# Patient Record
Sex: Female | Born: 1945 | Race: White | Hispanic: No | Smoking: Current every day smoker
Health system: Southern US, Community
[De-identification: ages and names within clinical notes are randomized; demographics above are authoritative.]

## PROBLEM LIST (undated history)

## (undated) DIAGNOSIS — K219 Gastro-esophageal reflux disease without esophagitis: Secondary | ICD-10-CM

## (undated) DIAGNOSIS — E7800 Pure hypercholesterolemia, unspecified: Secondary | ICD-10-CM

## (undated) DIAGNOSIS — K5190 Ulcerative colitis, unspecified, without complications: Secondary | ICD-10-CM

## (undated) DIAGNOSIS — K8689 Other specified diseases of pancreas: Secondary | ICD-10-CM

## (undated) DIAGNOSIS — Z972 Presence of dental prosthetic device (complete) (partial): Secondary | ICD-10-CM

## (undated) HISTORY — DX: Other specified diseases of pancreas: K86.89

## (undated) HISTORY — DX: Ulcerative colitis, unspecified, without complications: K51.90

## (undated) HISTORY — DX: Pure hypercholesterolemia, unspecified: E78.00

---
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
## 1965-10-21 HISTORY — PX: PILONIDAL CYST EXCISION: SHX744

## 2014-10-23 DIAGNOSIS — E7800 Pure hypercholesterolemia, unspecified: Secondary | ICD-10-CM | POA: Insufficient documentation

## 2014-10-23 HISTORY — DX: Pure hypercholesterolemia, unspecified: E78.00

## 2014-11-08 HISTORY — PX: COLONOSCOPY: SHX174

## 2015-01-22 DIAGNOSIS — K5190 Ulcerative colitis, unspecified, without complications: Secondary | ICD-10-CM

## 2015-01-22 HISTORY — DX: Ulcerative colitis, unspecified, without complications: K51.90

## 2015-03-07 ENCOUNTER — Other Ambulatory Visit: Payer: Self-pay

## 2015-03-07 DIAGNOSIS — K8689 Other specified diseases of pancreas: Secondary | ICD-10-CM | POA: Insufficient documentation

## 2015-03-07 HISTORY — DX: Other specified diseases of pancreas: K86.89

## 2015-03-08 ENCOUNTER — Encounter (INDEPENDENT_AMBULATORY_CARE_PROVIDER_SITE_OTHER): Payer: Self-pay

## 2015-03-08 ENCOUNTER — Encounter: Payer: Self-pay | Admitting: Gastroenterology

## 2015-03-08 ENCOUNTER — Ambulatory Visit (INDEPENDENT_AMBULATORY_CARE_PROVIDER_SITE_OTHER): Payer: Medicare Other | Admitting: Gastroenterology

## 2015-03-08 VITALS — BP 188/81 | HR 74 | Temp 97.8°F | Ht 64.0 in | Wt 120.0 lb

## 2015-03-08 DIAGNOSIS — K5120 Ulcerative (chronic) proctitis without complications: Secondary | ICD-10-CM

## 2015-03-08 NOTE — Progress Notes (Signed)
Gastroenterology Consultation  Referring Provider:     Kirk Ruths, MD Primary Care Physician:  Kirk Ruths., MD Primary Gastroenterologist:  Dr. Allen Norris     Reason for Consultation:     Ulcerative colitis        HPI:   Denise Moore is a 70 y.o. y/o female referred for consultation & management of  Ulcerative colitis by Dr. Kirk Ruths., MD.   This patient comes today after being by Dr. Rayann Heman.  The patient reports that she does not want to see him anymore and wants to transfer her care to me. She states that her colitis was limited to her rectum.She now reports that she has approximately one bowel movements a day. The patient also has a history of a anal fissure with surgical repair. There is no report of any unexplained weight loss, Fevers, chills, nausea or vomiting. She was found to have a elevated ESR and has had an increased white cell count.    Past Medical History  Diagnosis Date  . Chronic ulcerative colitis ([place]) 01/22/2015    Last Assessment & Plan:  Post decadron treatment last month. Off now and feeling great.    . Pancreatic insufficiency ([place]) 03/07/2015    Last Assessment & Plan:  On her pancrease supplement.    . Pure hypercholesterolemia 10/23/2014    Last Assessment & Plan:  On diet for now     Past Surgical History  Procedure Laterality Date  . Fistua repair  04/07/1956  . Pilonidal cyst excision  10/21/1965    Prior to Admission medications   Medication Sig Start Date End Date Taking? Authorizing Provider  Cetirizine HCl 10 MG CAPS Take by mouth. Reported on 03/08/2015 12/25/14   Historical Provider, MD  mesalamine (APRISO) 0.375 g 24 hr capsule Take 4 capsules (1.5 g total) by mouth once daily. 02/06/15  Yes Historical Provider, MD  Pancrelipase, Lip-Prot-Amyl, 24000 units CPEP Take by mouth. Reported on 03/08/2015 10/23/14 10/23/15  Historical Provider, MD  Probiotic CAPS Take by mouth. Reported on 03/08/2015    Historical Provider, MD    Family  History  Problem Relation Age of Onset  . Cancer Mother   . Diabetes Father   . Heart disease Father      Social History  Substance Use Topics  . Smoking status: Current Every Day Smoker  . Smokeless tobacco: Never Used  . Alcohol Use: No    Allergies as of 03/08/2015 - Review Complete 03/08/2015  Allergen Reaction Noted  . Other Itching 03/07/2015  . Penicillins Rash 03/07/2015    Review of Systems:    All systems reviewed and negative except where noted in HPI.   Physical Exam:  BP 188/81 mmHg  Pulse 74  Temp(Src) 97.8 F (36.6 C)  Ht 5' 4"  (1.626 m)  Wt 120 lb (54.432 kg)  BMI 20.59 kg/m2 No LMP recorded. Psych:  Alert and cooperative. Normal mood and affect. General:   Alert,  Well-developed, well-nourished, pleasant and cooperative in NAD Head:  Normocephalic and atraumatic. Eyes:  Sclera clear, no icterus.   Conjunctiva pink. Ears:  Normal auditory acuity. Nose:  No deformity, discharge, or lesions. Mouth:  No deformity or lesions,oropharynx pink & moist. Neck:  Supple; no masses or thyromegaly. Lungs:  Respirations even and unlabored.  Clear throughout to auscultation.   No wheezes, crackles, or rhonchi. No acute distress. Heart:  Regular rate and rhythm; no murmurs, clicks, rubs, or gallops. Abdomen:  Normal bowel sounds.  No bruits.  Soft, non-tender and non-distended without masses, hepatosplenomegaly or hernias noted.  No guarding or rebound tenderness.  Negative Carnett sign.   Rectal:  Deferred.  Msk:  Symmetrical without gross deformities.  Good, equal movement & strength bilaterally. Pulses:  Normal pulses noted. Extremities:  No clubbing or edema.  No cyanosis. Neurologic:  Alert and oriented x3;  grossly normal neurologically. Skin:  Intact without significant lesions or rashes.  No jaundice. Lymph Nodes:  No significant cervical adenopathy. Psych:  Alert and cooperative. Normal mood and affect.  Imaging Studies: No results found.  Assessment  and Plan:   Denise Moore is a 70 y.o. y/o female  Who has a history of ulcerative colitis diagnosed with a colonoscopy by Dr. Rayann Heman. The patient no longer wants to follow up with him. The patient is now on Apriso and takes 1 pill 4 times a day instead of 4 pils in the am as she is suppose to. The patient would like to decrease the number of pills she takes her day.. She has been told to decrease the number to 3 pills qam and see how she does. The patient has been explained the plan and agrees with it.   Note: This dictation was prepared with Dragon dictation along with smaller phrase technology. Any transcriptional errors that result from this process are unintentional.

## 2015-04-20 ENCOUNTER — Telehealth: Payer: Self-pay | Admitting: Gastroenterology

## 2015-04-23 ENCOUNTER — Telehealth: Payer: Self-pay | Admitting: Gastroenterology

## 2015-04-23 NOTE — Telephone Encounter (Signed)
Pt stated her diarrhea has worsen over the weekend. The worse being yesterday (Sunday) She currently is taking Apriso and Creon. She is requesting a medication to help stop the diarrhea.

## 2015-04-23 NOTE — Telephone Encounter (Signed)
After talking to you the other day she is now having a big flare up with her diarrhea. Please call

## 2015-04-24 NOTE — Telephone Encounter (Signed)
error 

## 2015-04-24 NOTE — Telephone Encounter (Signed)
Please start the patient on a steroid taper starting at 40 mg for 2 week and then decreasing by 10 mg.

## 2015-04-24 NOTE — Telephone Encounter (Signed)
Pt went to her PCP today and was prescribed prednisone taper by Mortimer Fries, PA. Advised her Dr. Allen Norris was going to prescribe prednisone as well. Told her to go ahead and start that and let me know if it doesn't work and I will discuss with Dr. Allen Norris and we can decide on a longer taper if needed.

## 2015-04-27 ENCOUNTER — Telehealth: Payer: Self-pay | Admitting: Gastroenterology

## 2015-04-27 NOTE — Telephone Encounter (Signed)
Patient would like to talk to you regarding the prednisone she is taking.

## 2015-04-27 NOTE — Telephone Encounter (Signed)
Advised pt to continue current course of her antibiotics and call me once she completes if she is still having diarrhea.

## 2015-05-08 ENCOUNTER — Other Ambulatory Visit: Payer: Self-pay

## 2015-05-08 DIAGNOSIS — K51819 Other ulcerative colitis with unspecified complications: Secondary | ICD-10-CM

## 2015-05-08 MED ORDER — MESALAMINE ER 0.375 G PO CP24: ORAL_CAPSULE | ORAL | Status: DC

## 2015-08-13 DIAGNOSIS — I10 Essential (primary) hypertension: Secondary | ICD-10-CM | POA: Insufficient documentation

## 2015-08-13 DIAGNOSIS — R030 Elevated blood-pressure reading, without diagnosis of hypertension: Secondary | ICD-10-CM | POA: Insufficient documentation

## 2015-10-02 ENCOUNTER — Telehealth: Payer: Self-pay

## 2015-10-02 ENCOUNTER — Other Ambulatory Visit: Payer: Self-pay

## 2015-10-02 DIAGNOSIS — K51919 Ulcerative colitis, unspecified with unspecified complications: Secondary | ICD-10-CM

## 2015-10-02 MED ORDER — PREDNISONE 20 MG PO TABS: ORAL_TABLET | ORAL | 0 refills | Status: DC

## 2015-10-02 NOTE — Telephone Encounter (Signed)
Pt notified Dr. Allen Norris approved her request for prednisone. This was called into her pharmacy.

## 2015-10-02 NOTE — Telephone Encounter (Signed)
Pt called today stating she is having a flare of her colitis. She is currently taking Apriso daily. Pt would like a round of Prednisone to calm down her flare. Please advise.

## 2015-10-02 NOTE — Telephone Encounter (Signed)
Yes. Please start at 4m per day and go down every 2 weeks.

## 2015-11-06 ENCOUNTER — Other Ambulatory Visit: Payer: Self-pay | Admitting: Gastroenterology

## 2015-11-26 ENCOUNTER — Telehealth: Payer: Self-pay | Admitting: Gastroenterology

## 2015-11-26 NOTE — Telephone Encounter (Signed)
Please advise if I can sent the refill or would you prefer a CRP first?

## 2015-11-26 NOTE — Telephone Encounter (Signed)
These refilled the prednisone.

## 2015-11-26 NOTE — Telephone Encounter (Signed)
Needs a refill on predisone. Feels like she is getting another flare up of UC. Please call

## 2015-11-27 NOTE — Telephone Encounter (Signed)
Advised her you ok'd the refill but is wondering if we needs to change the Apriso to something else? She is taking this 4 times daily but has started having watery stools for several days. Please advise.

## 2015-11-28 ENCOUNTER — Other Ambulatory Visit: Payer: Self-pay

## 2015-11-28 MED ORDER — PREDNISONE 20 MG PO TABS: ORAL_TABLET | ORAL | 0 refills | Status: DC

## 2015-11-28 NOTE — Telephone Encounter (Signed)
Patient called saying that she wants to take the prednisone and if you could have it sent to her pharmacy.

## 2015-11-28 NOTE — Telephone Encounter (Signed)
Rx sent to pt's pharmacy

## 2015-11-30 ENCOUNTER — Other Ambulatory Visit: Payer: Self-pay

## 2015-11-30 ENCOUNTER — Telehealth: Payer: Self-pay | Admitting: Gastroenterology

## 2015-11-30 MED ORDER — PREDNISONE 20 MG PO TABS: ORAL_TABLET | ORAL | 0 refills | Status: DC

## 2015-11-30 NOTE — Telephone Encounter (Signed)
Medication re-sent to pharmacy at this time.

## 2015-11-30 NOTE — Telephone Encounter (Signed)
Pt called and stated that she talked to Ginger this am, even though she knows the office was closed. Ginger stated that she called in the rx for Prednisone on Wed 11/28/15, but the pharmacy hasn't received it yet. Please advise.

## 2016-02-14 ENCOUNTER — Telehealth: Payer: Self-pay | Admitting: Gastroenterology

## 2016-02-14 NOTE — Telephone Encounter (Signed)
Left voice message for patient to call and schedule appt with GI for chronic ulcerative colitis. Referred by [facility] dr. Ouida Sills

## 2016-02-25 ENCOUNTER — Other Ambulatory Visit: Payer: Self-pay

## 2016-02-26 ENCOUNTER — Encounter: Payer: Self-pay | Admitting: Gastroenterology

## 2016-02-26 ENCOUNTER — Ambulatory Visit (INDEPENDENT_AMBULATORY_CARE_PROVIDER_SITE_OTHER): Payer: Medicare Other | Admitting: Gastroenterology

## 2016-02-26 VITALS — BP 164/76 | HR 83 | Temp 97.9°F | Ht 64.0 in | Wt 126.0 lb

## 2016-02-26 DIAGNOSIS — K51919 Ulcerative colitis, unspecified with unspecified complications: Secondary | ICD-10-CM

## 2016-02-26 MED ORDER — PANTOPRAZOLE SODIUM 40 MG PO TBEC: 40.0000 mg | DELAYED_RELEASE_TABLET | Freq: Every day | ORAL | 11 refills | Status: DC

## 2016-02-26 NOTE — Progress Notes (Signed)
Primary Care Physician: Kirk Ruths., MD  Primary Gastroenterologist:  Dr. Lucilla Lame  No chief complaint on file.   HPI: Denise Moore is a 71 y.o. female here for follow-up of her ulcerative colitis.  The patient had a colonoscopy in the past by Dr. Rayann Heman.  The patient comes in today with the report which appears to show pancolitis.  The patient is now on Apriso.  The patient has had a few episodes of diarrhea and a report of flares.  The patient has called in and been started on steroid tapers for her flares.  She reports that she had one watery bowel movement recently but otherwise has been stable.  Current Outpatient Prescriptions  Medication Sig Dispense Refill  . APRISO 0.375 g 24 hr capsule TAKE 4 CAPSULES(1.5 GRAMS) BY MOUTH EVERY DAY 120 capsule 6  . Cetirizine HCl 10 MG CAPS Take by mouth. Reported on 03/08/2015    . pantoprazole (PROTONIX) 40 MG tablet Take 1 tablet (40 mg total) by mouth daily. 30 tablet 11  . predniSONE (DELTASONE) 20 MG tablet Take taper dose starting at 75m x 2 weeks, 365m x 2 weeks, 2081mx 2 weeks, then 10 mgs x 2 weeks (Patient not taking: Reported on 02/26/2016) 60 tablet 0  . Probiotic CAPS Take by mouth. Reported on 03/08/2015     No current facility-administered medications for this visit.     Allergies as of 02/26/2016 - Review Complete 02/26/2016  Allergen Reaction Noted  . Other Itching 09/08/2014  . Penicillins Rash 03/07/2015    ROS:  General: Negative for anorexia, weight loss, fever, chills, fatigue, weakness. ENT: Negative for hoarseness, difficulty swallowing , nasal congestion. CV: Negative for chest pain, angina, palpitations, dyspnea on exertion, peripheral edema.  Respiratory: Negative for dyspnea at rest, dyspnea on exertion, cough, sputum, wheezing.  GI: See history of present illness. GU:  Negative for dysuria, hematuria, urinary incontinence, urinary frequency, nocturnal urination.  Endo: Negative for unusual weight  change.    Physical Examination:   BP (!) 164/76   Pulse 83   Temp 97.9 F (36.6 C) (Oral)   Ht 5' 4"  (1.626 m)   Wt 126 lb (57.2 kg)   BMI 21.63 kg/m   General: Well-nourished, well-developed in no acute distress.  Eyes: No icterus. Conjunctivae pink. Mouth: Oropharyngeal mucosa moist and pink , no lesions erythema or exudate. Lungs: Clear to auscultation bilaterally. Non-labored. Heart: Regular rate and rhythm, no murmurs rubs or gallops.  Abdomen: Bowel sounds are normal, nontender, nondistended, no hepatosplenomegaly or masses, no abdominal bruits or hernia , no rebound or guarding.   Extremities: No lower extremity edema. No clubbing or deformities. Neuro: Alert and oriented x 3.  Grossly intact. Skin: Warm and dry, no jaundice.   Psych: Alert and cooperative, normal mood and affect.  Labs:    Imaging Studies: No results found.  Assessment and Plan:   [name] a [age]. y/o female With a history of ulcerative colitis who is doing well on Apriso.  The patient has been told that if she has any further issues with suspected flares we will send her off for a CRP.  The patient will continue her Protonix which she has been given for heartburn.  The patient has been explained the plan and agrees with it.    DarLucilla LameD. FACMarval RegalNote: This dictation was prepared with Dragon dictation along with smaller phrase technology. Any transcriptional errors that result from this process are unintentional.

## 2016-03-14 ENCOUNTER — Telehealth: Payer: Self-pay | Admitting: Gastroenterology

## 2016-03-14 NOTE — Telephone Encounter (Signed)
Patient is calling to get results

## 2016-04-17 NOTE — Telephone Encounter (Signed)
Advised pt labs do no appear to have been done. Pt stated she turned them in but it does not show in process.

## 2016-05-22 ENCOUNTER — Other Ambulatory Visit: Payer: Self-pay | Admitting: Gastroenterology

## 2016-06-03 ENCOUNTER — Other Ambulatory Visit: Payer: Self-pay

## 2016-06-03 ENCOUNTER — Telehealth: Payer: Self-pay | Admitting: Gastroenterology

## 2016-06-03 MED ORDER — MESALAMINE ER 0.375 G PO CP24: ORAL_CAPSULE | ORAL | 5 refills | Status: DC

## 2016-06-03 NOTE — Telephone Encounter (Signed)
Needs refill on Apriso 0.375 gm [facility] in [place]

## 2016-06-04 NOTE — Telephone Encounter (Signed)
Refill for Denise Moore has been sent to pt's pharmacy per her request.

## 2016-06-23 ENCOUNTER — Telehealth: Payer: Self-pay | Admitting: Gastroenterology

## 2016-06-23 NOTE — Telephone Encounter (Signed)
Patient left a voice message that she needs a RX called in for a flare up. She didn't say what she needed.

## 2016-06-23 NOTE — Telephone Encounter (Signed)
Pt is requesting a round prednisone because she is having a flare of her UC. She is having multiple episodes of diarrhea. She is currently taking Apriso. Should we get a CRP first before refilling Prednisone?

## 2016-06-23 NOTE — Telephone Encounter (Signed)
Please set her up for a prednisone tap 6 weeks and have her comment to see Dr. Marius Ditch when she starts.

## 2016-06-24 ENCOUNTER — Other Ambulatory Visit: Payer: Self-pay

## 2016-06-24 MED ORDER — PREDNISONE 20 MG PO TABS: ORAL_TABLET | ORAL | 0 refills | Status: DC

## 2016-06-24 NOTE — Telephone Encounter (Signed)
Patient left a voice message that she was waiting on a phone call from you. No other info given

## 2016-06-24 NOTE — Telephone Encounter (Signed)
Rx called into pt's pharmacy.

## 2016-08-11 ENCOUNTER — Telehealth: Payer: Self-pay | Admitting: Gastroenterology

## 2016-08-11 NOTE — Telephone Encounter (Signed)
Patient is having a flare up and loose stools. Please call

## 2016-08-12 ENCOUNTER — Other Ambulatory Visit: Payer: Self-pay

## 2016-08-12 ENCOUNTER — Other Ambulatory Visit
Admission: RE | Admit: 2016-08-12 | Discharge: 2016-08-12 | Disposition: A | Discharge status: Home / Self Care | Payer: Medicare Other | Source: Ambulatory Visit | Attending: Gastroenterology | Admitting: Gastroenterology

## 2016-08-12 DIAGNOSIS — R197 Diarrhea, unspecified: Secondary | ICD-10-CM

## 2016-08-12 DIAGNOSIS — K51919 Ulcerative colitis, unspecified with unspecified complications: Secondary | ICD-10-CM | POA: Insufficient documentation

## 2016-08-12 LAB — C-REACTIVE PROTEIN: CRP: 2.5 mg/dL — ABNORMAL HIGH (ref <1.0)

## 2016-08-12 NOTE — Telephone Encounter (Signed)
Spoke with pt regarding her diarrhea. Pt stated she has titrated her prednisone down to 38m daily and she feels she is now having loose stools again. Advised her at her last office appt Dr. WAllen Norrishad requested a couple of labs to be done. We will add a CRP to those labs and [facility] if infact she is having a colitis flare.

## 2016-08-13 LAB — H. PYLORI ANTIGEN, STOOL: H. Pylori Stool Ag, Eia: NEGATIVE

## 2016-08-14 LAB — CALPROTECTIN, FECAL: Calprotectin, Fecal: 295 ug/g — ABNORMAL HIGH (ref 0–120)

## 2016-08-15 ENCOUNTER — Telehealth: Payer: Self-pay | Admitting: Gastroenterology

## 2016-08-15 NOTE — Telephone Encounter (Signed)
Please call patient with lab results

## 2016-08-18 ENCOUNTER — Telehealth: Payer: Self-pay | Admitting: Gastroenterology

## 2016-08-18 MED ORDER — PREDNISONE 10 MG PO TABS: ORAL_TABLET | ORAL | 0 refills | Status: DC

## 2016-08-18 NOTE — Telephone Encounter (Signed)
Please review labs and advise. They are resulted.

## 2016-08-18 NOTE — Telephone Encounter (Signed)
Patient would like to get results ASAP. She left a voice message that she is still having diarrhea and stomach pain. Please call

## 2016-08-18 NOTE — Telephone Encounter (Signed)
Start the patient on steroids and have her see Dr. Marius Ditch

## 2016-08-18 NOTE — Telephone Encounter (Signed)
Rx for Prednisone has been called into pt's pharmacy per Dr. Allen Norris. Pt has been notified.

## 2016-09-16 ENCOUNTER — Ambulatory Visit: Payer: Medicare Other | Admitting: Gastroenterology

## 2016-10-02 ENCOUNTER — Ambulatory Visit (INDEPENDENT_AMBULATORY_CARE_PROVIDER_SITE_OTHER): Payer: Medicare Other | Admitting: Gastroenterology

## 2016-10-02 ENCOUNTER — Other Ambulatory Visit
Admission: RE | Admit: 2016-10-02 | Discharge: 2016-10-02 | Disposition: A | Discharge status: Home / Self Care | Payer: Medicare Other | Source: Ambulatory Visit | Attending: Gastroenterology | Admitting: Gastroenterology

## 2016-10-02 ENCOUNTER — Other Ambulatory Visit: Payer: Self-pay

## 2016-10-02 ENCOUNTER — Encounter: Payer: Self-pay | Admitting: Gastroenterology

## 2016-10-02 ENCOUNTER — Ambulatory Visit: Payer: Medicare Other | Admitting: Gastroenterology

## 2016-10-02 ENCOUNTER — Encounter (INDEPENDENT_AMBULATORY_CARE_PROVIDER_SITE_OTHER): Payer: Self-pay

## 2016-10-02 VITALS — BP 174/78 | Temp 99.0°F | Ht 64.0 in | Wt 123.0 lb

## 2016-10-02 DIAGNOSIS — K5100 Ulcerative (chronic) pancolitis without complications: Secondary | ICD-10-CM | POA: Insufficient documentation

## 2016-10-02 DIAGNOSIS — Z0000 Encounter for general adult medical examination without abnormal findings: Secondary | ICD-10-CM

## 2016-10-02 DIAGNOSIS — K8681 Exocrine pancreatic insufficiency: Secondary | ICD-10-CM

## 2016-10-02 DIAGNOSIS — Z7952 Long term (current) use of systemic steroids: Secondary | ICD-10-CM | POA: Diagnosis not present

## 2016-10-02 DIAGNOSIS — K8689 Other specified diseases of pancreas: Secondary | ICD-10-CM

## 2016-10-02 LAB — CBC
HEMATOCRIT: 42.9 % (ref 35.0–47.0)
HEMOGLOBIN: 14.6 g/dL (ref 12.0–16.0)
MCH: 30.8 pg (ref 26.0–34.0)
MCHC: 33.9 g/dL (ref 32.0–36.0)
MCV: 90.9 fL (ref 80.0–100.0)
Platelets: 213 10*3/uL (ref 150–440)
RBC: 4.73 MIL/uL (ref 3.80–5.20)
RDW: 14.4 % (ref 11.5–14.5)
WBC: 16.2 10*3/uL — ABNORMAL HIGH (ref 3.6–11.0)

## 2016-10-02 LAB — BASIC METABOLIC PANEL
ANION GAP: 9 (ref 5–15)
BUN: 9 mg/dL (ref 6–20)
CHLORIDE: 102 mmol/L (ref 101–111)
CO2: 25 mmol/L (ref 22–32)
CREATININE: 0.67 mg/dL (ref 0.44–1.00)
Calcium: 8.8 mg/dL — ABNORMAL LOW (ref 8.9–10.3)
GFR calc non Af Amer: >60 mL/min (ref >60)
Glucose, Bld: 94 mg/dL (ref 65–99)
Potassium: 3.8 mmol/L (ref 3.5–5.1)
Sodium: 136 mmol/L (ref 135–145)

## 2016-10-02 LAB — HEPATIC FUNCTION PANEL
ALBUMIN: 3.9 g/dL (ref 3.5–5.0)
ALT: 15 U/L (ref 14–54)
AST: 18 U/L (ref 15–41)
Alkaline Phosphatase: 80 U/L (ref 38–126)
Bilirubin, Direct: <0.1 mg/dL — ABNORMAL LOW (ref 0.1–0.5)
TOTAL PROTEIN: 7.6 g/dL (ref 6.5–8.1)
Total Bilirubin: 0.5 mg/dL (ref 0.3–1.2)

## 2016-10-02 LAB — IRON AND TIBC
Iron: 22 ug/dL — ABNORMAL LOW (ref 28–170)
Saturation Ratios: 7 % — ABNORMAL LOW (ref 10.4–31.8)
TIBC: 304 ug/dL (ref 250–450)
UIBC: 282 ug/dL

## 2016-10-02 LAB — FERRITIN: FERRITIN: 32 ng/mL (ref 11–307)

## 2016-10-02 LAB — PROTIME-INR
INR: 1.02
PROTHROMBIN TIME: 13.3 s (ref 11.4–15.2)

## 2016-10-02 MED ORDER — PANCRELIPASE (LIP-PROT-AMYL) 40000-126000 UNITS PO CPEP: 80000.0000 [IU] | ORAL_CAPSULE | Freq: Three times a day (TID) | ORAL | 1 refills | Status: DC

## 2016-10-02 MED ORDER — BALSALAZIDE DISODIUM 750 MG PO CAPS: 2250.0000 mg | ORAL_CAPSULE | Freq: Three times a day (TID) | ORAL | 2 refills | Status: DC

## 2016-10-02 MED ORDER — BUDESONIDE ER 9 MG PO TB24: 9.0000 mg | ORAL_TABLET | Freq: Every day | ORAL | 0 refills | Status: DC

## 2016-10-02 NOTE — Patient Instructions (Addendum)
1. Start mesalamine as prescribed 2. Start uceris as prescribed 3. CT pancreas and DEXA scan 4. EGD to look for celiac 5. Start pancreatic enzymes as directed  Please call our office to speak with my nurse Driscilla Grammes at [phone] during business hours from 8am to 4pm if you have any questions/concerns. During after hours, you will be redirected to on call GI physician. For any emergency please call 911 or go the nearest emergency room.    Cephas Darby, MD [address]  [place]  [place], [place] [address]  Main: [phone]  Fax: [phone]

## 2016-10-02 NOTE — Progress Notes (Addendum)
Cephas Darby, MD [address]  [place]  [place], [place] [address]  Main: [phone]  Fax: [phone]    Gastroenterology Consultation  Referring Provider:     Kirk Ruths, MD Primary Care Physician:  Kirk Ruths, MD Primary Gastroenterologist:  Dr. Cephas Darby Reason for Consultation:     Ulcerative colitis        HPI:   Denise Moore is a 71 y.o. y/o female referred by Dr. Ouida Sills, Ocie Cornfield, MD  for consultation & management of Ulcerative colitis. She was previously seen by my partner, Dr.Wohl and prior to him by Dr.Rein. She is diagnosed with ulcerative colitis based on a colonoscopy in Apr 20, 2014, detail history as described below. Currently, she just finished prednisone course about 5 days ago and having 2 loose bowel movements, without blood per day. She is taking Apriso 4 pills daily. She reports occasional urgency, denies fecal incontinence, nocturnal symptoms, weight loss. She does have mild lower abdominal pain. She denies nausea, vomiting, fever, chills. She does smoke about 1 pack a day since her husband passed away at age 56 from cardiac arrest but does not drink alcohol. She denies taking NSAIDs or any antibiotics.  One of her sons was deceased in 1999/04/21 from cardiac arrest. She lives by herself  She reports that, she has to pay about $6000 to her insurance company and she cannot afford that much amount. She is planning to change her insurance.  IBD diagnosis: 11/08/2014   Disease course: About 1 year history of Nonbloody diarrhea associated with urgency, elevated leukocytosis, mild symptom improvement with Cipro and Flagyl. Recurrence of symptoms and worsening leukocytosis, and abdominal pain. She had very low fecal pancreatic elastase 80, other stool studies were normal. TTG was elevated and CRP was 7.8. Colonoscopy in 11/2014 showed diffuse mild to moderate inflammation throughout the entire colon and rectum was spared, pathology consistent  with UC per records. She initially was seen by Dr.Rein in [place] [facility] and was prescribed Apriso 1.5gm daily but she did not take it due to high cost. She then started seeing Dr.Wohl, at the time she was taking Apriso 1.5 g daily. She continued to have flareups. She used to experience severe diarrhea up to 12 per day, nocturnal, fatigue, abdominal pain. She had at least 4-5 flareups so far and she was receiving prednisone 40 mg or 30 mg for 2 weeks at a time. Her symptoms respond very well to prednisone. CRP 2.5 on 08/12/2016, H. pylori stool antigen negative, fecal calprotectin was 295 on 08/12/2016. She is supposed to be on pancreatic enzyme supplements but denies taking them.   Extra intestinal manifestations: None She has history of skin cancer several years ago, did not see dermatologist for follow-up IBD surgical history: None She had fistula in ano, left external fistula, underwent fistulotomy in 04/20/1966  Imaging:  MRE: None CTE: None SBFT: None  Procedures:  Colonoscopy 11/08/2014 The perianal and digital rectal exam was normal. Diffuse moderate inflammation characterized by erosions, erythema, granularity was found in the sigmoid colon, in the descending colon, in the transverse colon, at the hepatic flexure and in the cecum. The severity was mild throughout slightly worse in the sigmoid and descending colon. Rectum appears to be spared. Internal hemorrhoids were found on the retroflexion. Many small and large mouth diverticula were found in the sigmoid colon. Pathology report not available  Upper Endoscopy not done  VCE not done  IBD medications Steroids: Prednisone responsive, well-tolerated. Budesonide none 5-ASA:  mesalamine Apriso 1.5 g daily Immunomodulators: AZA, methotrexate nave TPMT status unknown Biologics: Nave Anti TNFs: Anti Integrins: Ustekinumab: Tofactinib: Clinical trial:   Past Medical History:  Diagnosis Date  . Chronic ulcerative colitis ([place])  01/22/2015   Last Assessment & Plan:  Post decadron treatment last month. Off now and feeling great.    . Pancreatic insufficiency 03/07/2015   Last Assessment & Plan:  On her pancrease supplement.    . Pure hypercholesterolemia 10/23/2014   Last Assessment & Plan:  On diet for now     Past Surgical History:  Procedure Laterality Date  . fistua repair  04/07/1956  . PILONIDAL CYST EXCISION  10/21/1965    Prior to Admission medications   Medication Sig Start Date End Date Taking? Authorizing Provider  mesalamine (APRISO) 0.375 g 24 hr capsule TAKE 4 CAPSULES(1.5 GRAMS) BY MOUTH EVERY DAY 06/03/16  Yes Lucilla Lame, MD  pantoprazole (PROTONIX) 40 MG tablet Take 1 tablet (40 mg total) by mouth daily. 02/26/16 02/25/17 Yes Lucilla Lame, MD    Family History  Problem Relation Age of Onset  . Cancer Mother   . Diabetes Father   . Heart disease Father      Social History  Substance Use Topics  . Smoking status: Current Every Day Smoker  . Smokeless tobacco: Never Used  . Alcohol use No    Allergies as of 10/02/2016 - Review Complete 10/02/2016  Allergen Reaction Noted  . Other Itching 09/08/2014  . Penicillins Rash 03/07/2015    Review of Systems:    All systems reviewed and negative except where noted in HPI.   Physical Exam:  BP (!) 174/78   Temp 99 F (37.2 C) (Oral)   Ht 5' 4"  (1.626 m)   Wt 123 lb (55.8 kg)   BMI 21.11 kg/m  No LMP recorded.  General:   Alert,  Well-developed, well-nourished, pleasant and cooperative in NAD Head:  Normocephalic and atraumatic. Eyes:  Sclera clear, no icterus.   Conjunctiva pink. Ears:  Normal auditory acuity. Nose:  No deformity, discharge, or lesions. Mouth:  Dental caries, dental implants, cavities, nontender Neck:  Supple; no masses or thyromegaly. Lungs:  Respirations even and unlabored.  Clear throughout to auscultation.   No wheezes, crackles, or rhonchi. No acute distress. Heart:  Regular rate and rhythm; no murmurs, clicks,  rubs, or gallops. Abdomen:  Normal bowel sounds.  No bruits.  Soft, mild lower abdominal tenderness and non-distended without masses, hepatosplenomegaly or hernias noted.  No guarding or rebound tenderness.   Rectal: Not performed, perianal exam revealed skin tags and small external opening close to the anus, nontender and no fluctuant fluid collection appreciated Msk:  Symmetrical without gross deformities. Good, equal movement & strength bilaterally. Pulses:  Normal pulses noted. Extremities:  No clubbing or edema.  No cyanosis. Neurologic:  Alert and oriented x3;  grossly normal neurologically. Skin:  Intact without significant lesions or rashes. No jaundice. Lymph Nodes:  No significant cervical adenopathy. Psych:  Alert and cooperative. Normal mood and affect.   Assessment and Plan:   Denise Moore is a 71 y.o. y/o female with Ulcerative pancolitis, steroid responsive, exocrine pancreatic insufficiency, elevated serum TTG currently on Apriso 1.5 g daily in partial remission, recent flareup responded to prednisone 30 mg for 2 weeks.  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 Anemia: Check CBC, iron studies 3.Immunizations: Hep A and B, serologies ordered, Influenza declined, prevnar not received, pneumovax received on 10/23/2014, Varicella unknown, Zoster vaccine unknown 4.Cancer screening I) Colon cancer/dysplasia surveillance: Schedule colonoscopy II) Cervical  cancer: not present III) Skin cancer - counseled about annual skin exam by dermatology and skin protection in summer using sun screen SPF > 50, clothing 5.Bone health Vitamin D status: Unknown Bone density testing: Ordered 5. Labs: Ordered 6. Smoking: Need to quit smoking 7. NSAIDs and Antibiotics use: None  Follow up in 6 weeks   Cephas Darby, MD

## 2016-10-02 NOTE — Addendum Note (Signed)
Addended by: Cephas Darby on: 10/02/2016 07:15 PM   Modules accepted: Level of Service

## 2016-10-03 ENCOUNTER — Telehealth: Payer: Self-pay | Admitting: Gastroenterology

## 2016-10-03 ENCOUNTER — Other Ambulatory Visit: Payer: Self-pay

## 2016-10-03 DIAGNOSIS — K909 Intestinal malabsorption, unspecified: Secondary | ICD-10-CM

## 2016-10-03 LAB — MISC LABCORP TEST (SEND OUT): Labcorp test code: 144050

## 2016-10-03 LAB — HEPATITIS B SURFACE ANTIGEN: HEP B S AG: NEGATIVE

## 2016-10-03 LAB — HEPATITIS A ANTIBODY, TOTAL: Hep A Total Ab: POSITIVE — AB

## 2016-10-03 LAB — HEPATITIS B SURFACE ANTIBODY,QUALITATIVE: Hep B S Ab: NONREACTIVE

## 2016-10-03 NOTE — Telephone Encounter (Signed)
Patient left a voice message that she can't afford the Zenpet. It cost 489.00. Please call

## 2016-10-03 NOTE — Telephone Encounter (Signed)
Patient has been scheduled for EGD 10/22/16 however she said she did not want to have Colonoscopy done.  She said that would be too much.  Explained to her that we could do both at the same time.  She said Dr. Allen Norris did Colonoscopy 2 years ago it showed colitis and didn't need to be repeated until 10 years. She said to let you know she can not afford her Budesonide it cost $2,000, and the pancreatic enzymes are $400.  Thanks  Denise Moore

## 2016-10-07 LAB — QUANTIFERON IN TUBE
QFT TB AG MINUS NIL VALUE: 0 IU/mL
QUANTIFERON MITOGEN VALUE: 3.56 [IU]/mL
QUANTIFERON NIL VALUE: 0.08 [IU]/mL
QUANTIFERON TB AG VALUE: 0.08 IU/mL
QUANTIFERON TB GOLD: NEGATIVE

## 2016-10-07 LAB — QUANTIFERON TB GOLD ASSAY (BLOOD)

## 2016-10-10 ENCOUNTER — Telehealth: Payer: Self-pay

## 2016-10-10 NOTE — Telephone Encounter (Signed)
Denise Moore is not interested in [facility] for the following reasons: Cost, and the fact that it is an infusion medication.  She will discuss this further with you when she comes in for her follow up appt. Thanks [employer]

## 2016-10-14 ENCOUNTER — Other Ambulatory Visit: Payer: Self-pay

## 2016-10-14 ENCOUNTER — Ambulatory Visit
Admission: RE | Admit: 2016-10-14 | Discharge: 2016-10-14 | Disposition: A | Discharge status: Home / Self Care | Payer: Medicare Other | Source: Ambulatory Visit | Attending: Gastroenterology | Admitting: Gastroenterology

## 2016-10-14 DIAGNOSIS — K8689 Other specified diseases of pancreas: Secondary | ICD-10-CM | POA: Diagnosis not present

## 2016-10-14 DIAGNOSIS — R590 Localized enlarged lymph nodes: Secondary | ICD-10-CM | POA: Diagnosis not present

## 2016-10-14 DIAGNOSIS — I700 Atherosclerosis of aorta: Secondary | ICD-10-CM | POA: Insufficient documentation

## 2016-10-14 DIAGNOSIS — K5289 Other specified noninfective gastroenteritis and colitis: Secondary | ICD-10-CM | POA: Insufficient documentation

## 2016-10-14 DIAGNOSIS — K8020 Calculus of gallbladder without cholecystitis without obstruction: Secondary | ICD-10-CM | POA: Diagnosis not present

## 2016-10-14 DIAGNOSIS — K8681 Exocrine pancreatic insufficiency: Secondary | ICD-10-CM

## 2016-10-14 MED ORDER — IOPAMIDOL (ISOVUE-370) INJECTION 76%
100.0000 mL | Freq: Once | INTRAVENOUS | Status: AC | PRN
  Administered 2016-10-14: 100 mL via INTRAVENOUS

## 2016-10-14 MED ORDER — PANCRELIPASE (LIP-PROT-AMYL) 36000-114000 UNITS PO CPEP: ORAL_CAPSULE | ORAL | 6 refills | Status: DC

## 2016-10-15 ENCOUNTER — Ambulatory Visit (INDEPENDENT_AMBULATORY_CARE_PROVIDER_SITE_OTHER): Payer: Medicare Other | Admitting: Gastroenterology

## 2016-10-15 ENCOUNTER — Encounter: Payer: Self-pay | Admitting: Gastroenterology

## 2016-10-15 ENCOUNTER — Telehealth: Payer: Self-pay

## 2016-10-15 VITALS — BP 160/84 | HR 102 | Temp 98.5°F | Ht 64.0 in | Wt 117.2 lb

## 2016-10-15 DIAGNOSIS — K5100 Ulcerative (chronic) pancolitis without complications: Secondary | ICD-10-CM | POA: Diagnosis not present

## 2016-10-15 MED ORDER — PREDNISONE 10 MG PO TABS: 30.0000 mg | ORAL_TABLET | Freq: Every day | ORAL | 0 refills | Status: DC

## 2016-10-15 NOTE — Telephone Encounter (Signed)
Pt has been left a voice message to contact me regarding colonoscopy being added to her EGD.  Per Linus Orn her colonoscopy/ ED is a covered benefit no authorization is needed for it. I plan on asking her to call Medicare to discuss her insurance benefits with them personally.  Colonoscopy has been added to EGD for 10/22/16.  Thanks [employer]

## 2016-10-15 NOTE — Progress Notes (Signed)
Cephas Darby, MD [address]  [place]  [place], [place] [address]  Main: [phone]  Fax: [phone]    Gastroenterology Consultation  Referring Provider:     Kirk Ruths, MD Primary Care Physician:  Kirk Ruths, MD Primary Gastroenterologist:  Dr. Cephas Darby Reason for Consultation:     Ulcerative colitis        HPI:   Denise Moore is a 71 y.o. y/o female referred by Dr. Ouida Sills, Ocie Cornfield, MD  for consultation & management of Ulcerative colitis. She was previously seen by my partner, Dr.Wohl and prior to him by Dr.Rein. She is diagnosed with ulcerative colitis based on a colonoscopy in 04-19-2014, detail history as described below. Currently, she just finished prednisone course about 5 days ago and having 2 loose bowel movements, without blood per day. She is taking Apriso 4 pills daily. She reports occasional urgency, denies fecal incontinence, nocturnal symptoms, weight loss. She does have mild lower abdominal pain. She denies nausea, vomiting, fever, chills. She does smoke about 1 pack a day since her husband passed away at age 79 from cardiac arrest but does not drink alcohol. She denies taking NSAIDs or any antibiotics.  One of her sons was deceased in 1999/04/20 from cardiac arrest. She lives by herself  She reports that, she has to pay about $6000 to her insurance company and she cannot afford that much amount. She is planning to change her insurance.  Follow-up visit 10/15/2016 She reports not feeling good, she thinks she has a flareup again, unable to tolerate by mouth due to nausea, has nonbloody mushy stools up to 8 per day associated with abdominal discomfort. She lost about 5 pounds since last visit. She had a CT abdomen pelvis which revealed colitis and pancreatic atrophy. She could not tolerate balsalazide as it causes stomach pain. She could not afford Zenpep due to co-pay of $400. Her insurance did not cover uceris. She is not interested in  entyvio either due to high co-pay  IBD diagnosis: 11/08/2014   Disease course: About 1 year history of Nonbloody diarrhea associated with urgency, elevated leukocytosis, mild symptom improvement with Cipro and Flagyl. Recurrence of symptoms and worsening leukocytosis, and abdominal pain. She had very low fecal pancreatic elastase 80, other stool studies were normal. TTG was elevated and CRP was 7.8. Colonoscopy in 11/2014 showed diffuse mild to moderate inflammation throughout the entire colon and rectum was spared, pathology consistent with UC per records. She initially was seen by Dr.Rein in [facility] and was prescribed Apriso 1.5gm daily but she did not take it due to high cost. She then started seeing Dr.Wohl, at the time she was taking Apriso 1.5 g daily. She continued to have flareups. She used to experience severe diarrhea up to 12 per day, nocturnal, fatigue, abdominal pain. She had at least 4-5 flareups so far and she was receiving prednisone 40 mg or 30 mg for 2 weeks at a time. Her symptoms respond very well to prednisone. CRP 2.5 on 08/12/2016, H. pylori stool antigen negative, fecal calprotectin was 295 on 08/12/2016. She is supposed to be on pancreatic enzyme supplements but denies taking them.   Extra intestinal manifestations: None She has history of skin cancer several years ago, did not see dermatologist for follow-up IBD surgical history: None She had fistula in ano, left external fistula, underwent fistulotomy in 1966/04/19  Imaging:  MRE: None CTE: None SBFT: None  Procedures:  Colonoscopy 11/08/2014 The perianal and digital  rectal exam was normal. Diffuse moderate inflammation characterized by erosions, erythema, granularity was found in the sigmoid colon, in the descending colon, in the transverse colon, at the hepatic flexure and in the cecum. The severity was mild throughout slightly worse in the sigmoid and descending colon. Rectum appears to be spared. Internal  hemorrhoids were found on the retroflexion. Many small and large mouth diverticula were found in the sigmoid colon. Pathology report not available  Upper Endoscopy not done  VCE not done  IBD medications Steroids: Prednisone responsive, well-tolerated. Budesonide none 5-ASA: mesalamine Apriso 1.5 g daily Immunomodulators: AZA, methotrexate nave TPMT status unknown Biologics: Nave Anti TNFs: Anti Integrins: Ustekinumab: Tofactinib: Clinical trial:   Past Medical History:  Diagnosis Date  . Chronic ulcerative colitis ([place]) 01/22/2015   Last Assessment & Plan:  Post decadron treatment last month. Off now and feeling great.    Marland Kitchen GERD (gastroesophageal reflux disease)   . Pancreatic insufficiency 03/07/2015   Last Assessment & Plan:  On her pancrease supplement.    . Pure hypercholesterolemia 10/23/2014   Last Assessment & Plan:  On diet for now   . Wears upper dentures     Past Surgical History:  Procedure Laterality Date  . COLONOSCOPY  11/08/2014  . fistua repair  04/07/1956  . PILONIDAL CYST EXCISION  10/21/1965    Prior to Admission medications   Medication Sig Start Date End Date Taking? Authorizing Provider  mesalamine (APRISO) 0.375 g 24 hr capsule TAKE 4 CAPSULES(1.5 GRAMS) BY MOUTH EVERY DAY 06/03/16  Yes Lucilla Lame, MD  pantoprazole (PROTONIX) 40 MG tablet Take 1 tablet (40 mg total) by mouth daily. 02/26/16 02/25/17 Yes Lucilla Lame, MD    Family History  Problem Relation Age of Onset  . Cancer Mother   . Diabetes Father   . Heart disease Father      Social History  Substance Use Topics  . Smoking status: Current Every Day Smoker    Packs/day: 0.50    Years: 50.00    Types: Cigarettes  . Smokeless tobacco: Never Used  . Alcohol use No    Allergies as of 10/15/2016 - Review Complete 10/14/2016  Allergen Reaction Noted  . Other Itching 09/08/2014  . Penicillins Rash 03/07/2015    Review of Systems:    All systems reviewed and negative except where  noted in HPI.   Physical Exam:  BP (!) 160/84   Pulse (!) 102   Temp 98.5 F (36.9 C) (Oral)   Ht 5' 4"  (1.626 m)   Wt 117 lb 3.2 oz (53.2 kg)   BMI 20.12 kg/m  No LMP recorded. Patient is postmenopausal.  General:   Alert,  Well-developed, well-nourished, pleasant and cooperative in NAD Head:  Normocephalic and atraumatic. Eyes:  Sclera clear, no icterus.   Conjunctiva pink. Ears:  Normal auditory acuity. Nose:  No deformity, discharge, or lesions. Mouth:  Dental caries, dental implants, cavities, nontender Neck:  Supple; no masses or thyromegaly. Lungs:  Respirations even and unlabored.  Clear throughout to auscultation.   No wheezes, crackles, or rhonchi. No acute distress. Heart:  Regular rate and rhythm; no murmurs, clicks, rubs, or gallops. Abdomen:  Normal bowel sounds.  No bruits.  Soft, mild lower abdominal tenderness and non-distended without masses, hepatosplenomegaly or hernias noted.  No guarding or rebound tenderness.   Rectal: Not performed, perianal exam revealed skin tags and small external opening close to the anus, nontender and no fluctuant fluid collection appreciated Msk:  Symmetrical without gross deformities.  Good, equal movement & strength bilaterally. Pulses:  Normal pulses noted. Extremities:  No clubbing or edema.  No cyanosis. Neurologic:  Alert and oriented x3;  grossly normal neurologically. Skin:  Intact without significant lesions or rashes. No jaundice. Lymph Nodes:  No significant cervical adenopathy. Psych:  Alert and cooperative. Normal mood and affect.   Assessment and Plan:   Denise Moore is a 71 y.o. y/o female with Ulcerative pancolitis, steroid responsive, exocrine pancreatic insufficiency, elevated serum TTG currently on Apriso 1.5 g daily in partial remission, recent flareup responded to prednisone 30 mg for 2 weeks.  Ulcerative colitis: Uncontrolled, she responds to intermittent courses of steroids  - I will give her a short course of  prednisone as she is going through flareup - She did not tolerate balsalazide - She wants to continue Apriso  - She will need colonoscopy with biopsies to restage her disease before deciding on immunosuppressive therapy - I'll start the process for getting approval for entyvio or Humira or Remicade   Exocrine pancreatic insufficiency: Likely secondary to chronic pancreatitis from tobacco use. CT pancreas protocol did not reveal any pancreatic lesions.  - Will switch to Creon 72,000 units each meal  - Strongly recommended to quit smoking  Elevated serum TTG: - EGD with biopsies to rule out celiac disease  IBD Health Maintenance  1.TB status: Gold quantiferon negative 2. Anemia: Not present. Hemoglobin 14.6, iron studies suggest normal ferritin 32  3.Immunizations:  she is immune to hepatitis A, hep B surface antigen and antibody are negative, Influenza declined, prevnar not received, pneumovax received on 10/23/2014, Varicella unknown, Zoster vaccine unknown 4.Cancer screening I) Colon cancer/dysplasia surveillance: N/A. Colonoscopy in 2016 with no evidence of dysplasia  Schedule colonoscopy for restaging of UC  II) Cervical cancer: not present III) Skin cancer - counseled about annual skin exam by dermatology and skin protection in summer using sun screen SPF > 50, clothing 5.Bone health Vitamin D status: Unknown Bone density testing: Ordered, not scheduled yet  5. Labs: to date  82. Smoking: Need to quit smoking 7. NSAIDs and Antibiotics use: None  Follow up after EGD and colonoscopy  Cephas Darby, MD

## 2016-10-20 ENCOUNTER — Telehealth: Payer: Self-pay | Admitting: Gastroenterology

## 2016-10-20 NOTE — Telephone Encounter (Signed)
Please call patient regarding her colonoscopy. She needs a prep called in and some information

## 2016-10-20 NOTE — Discharge Instructions (Signed)
General Anesthesia, Adult, Care After °These instructions provide you with information about caring for yourself after your procedure. Your health care provider may also give you more specific instructions. Your treatment has been planned according to current medical practices, but problems sometimes occur. Call your health care provider if you have any problems or questions after your procedure. °What can I expect after the procedure? °After the procedure, it is common to have: °· Vomiting. °· A sore throat. °· Mental slowness. ° °It is common to feel: °· Nauseous. °· Cold or shivery. °· Sleepy. °· Tired. °· Sore or achy, even in parts of your body where you did not have surgery. ° °Follow these instructions at home: °For at least 24 hours after the procedure: °· Do not: °? Participate in activities where you could fall or become injured. °? Drive. °? Use heavy machinery. °? Drink alcohol. °? Take sleeping pills or medicines that cause drowsiness. °? Make important decisions or sign legal documents. °? Take care of children on your own. °· Rest. °Eating and drinking °· If you vomit, drink water, juice, or soup when you can drink without vomiting. °· Drink enough fluid to keep your urine clear or pale yellow. °· Make sure you have little or no nausea before eating solid foods. °· Follow the diet recommended by your health care provider. °General instructions °· Have a responsible adult stay with you until you are awake and alert. °· Return to your normal activities as told by your health care provider. Ask your health care provider what activities are safe for you. °· Take over-the-counter and prescription medicines only as told by your health care provider. °· If you smoke, do not smoke without supervision. °· Keep all follow-up visits as told by your health care provider. This is important. °Contact a health care provider if: °· You continue to have nausea or vomiting at home, and medicines are not helpful. °· You  cannot drink fluids or start eating again. °· You cannot urinate after 8-12 hours. °· You develop a skin rash. °· You have fever. °· You have increasing redness at the site of your procedure. °Get help right away if: °· You have difficulty breathing. °· You have chest pain. °· You have unexpected bleeding. °· You feel that you are having a life-threatening or urgent problem. °This information is not intended to replace advice given to you by your health care provider. Make sure you discuss any questions you have with your health care provider. °Document Released: 04/28/2000 Document Revised: 06/25/2015 Document Reviewed: 01/04/2015 °Elsevier Interactive Patient Education © 2018 Elsevier Inc. ° °

## 2016-10-21 NOTE — Telephone Encounter (Signed)
Checked in on Denise Moore made sure she understood how to go about her prep for todays clear liquids and bowel prep.  She said she understood her clear liquid diet and directions provided with the bowel prep.  She said she is doing much better with the prednisone and is feeling like her old self.  Thanks [employer]

## 2016-10-22 ENCOUNTER — Encounter
Admission: RE | Disposition: A | Discharge status: Home / Self Care | Payer: Self-pay | Source: Ambulatory Visit | Attending: Gastroenterology

## 2016-10-22 ENCOUNTER — Telehealth: Payer: Self-pay

## 2016-10-22 ENCOUNTER — Ambulatory Visit
Admission: RE | Admit: 2016-10-22 | Discharge: 2016-10-22 | Disposition: A | Discharge status: Home / Self Care | Payer: Medicare Other | Source: Ambulatory Visit | Attending: Gastroenterology | Admitting: Gastroenterology

## 2016-10-22 ENCOUNTER — Ambulatory Visit: Payer: Medicare Other | Admitting: Anesthesiology

## 2016-10-22 DIAGNOSIS — K644 Residual hemorrhoidal skin tags: Secondary | ICD-10-CM | POA: Diagnosis not present

## 2016-10-22 DIAGNOSIS — R7689 Other specified abnormal immunological findings in serum: Secondary | ICD-10-CM

## 2016-10-22 DIAGNOSIS — R760 Raised antibody titer: Secondary | ICD-10-CM | POA: Insufficient documentation

## 2016-10-22 DIAGNOSIS — K5100 Ulcerative (chronic) pancolitis without complications: Secondary | ICD-10-CM | POA: Diagnosis not present

## 2016-10-22 DIAGNOSIS — K3189 Other diseases of stomach and duodenum: Secondary | ICD-10-CM | POA: Diagnosis not present

## 2016-10-22 DIAGNOSIS — K228 Other specified diseases of esophagus: Secondary | ICD-10-CM | POA: Diagnosis not present

## 2016-10-22 DIAGNOSIS — F17210 Nicotine dependence, cigarettes, uncomplicated: Secondary | ICD-10-CM | POA: Diagnosis not present

## 2016-10-22 DIAGNOSIS — K210 Gastro-esophageal reflux disease with esophagitis: Secondary | ICD-10-CM | POA: Insufficient documentation

## 2016-10-22 DIAGNOSIS — K909 Intestinal malabsorption, unspecified: Secondary | ICD-10-CM

## 2016-10-22 DIAGNOSIS — K6389 Other specified diseases of intestine: Secondary | ICD-10-CM | POA: Insufficient documentation

## 2016-10-22 DIAGNOSIS — K2950 Unspecified chronic gastritis without bleeding: Secondary | ICD-10-CM | POA: Insufficient documentation

## 2016-10-22 DIAGNOSIS — R768 Other specified abnormal immunological findings in serum: Secondary | ICD-10-CM

## 2016-10-22 DIAGNOSIS — K648 Other hemorrhoids: Secondary | ICD-10-CM | POA: Insufficient documentation

## 2016-10-22 HISTORY — PX: ESOPHAGOGASTRODUODENOSCOPY: SHX5428

## 2016-10-22 HISTORY — DX: Presence of dental prosthetic device (complete) (partial): Z97.2

## 2016-10-22 HISTORY — DX: Gastro-esophageal reflux disease without esophagitis: K21.9

## 2016-10-22 HISTORY — PX: COLONOSCOPY: SHX5424

## 2016-10-22 SURGERY — EGD (ESOPHAGOGASTRODUODENOSCOPY)
Anesthesia: General | Site: Esophagus | Wound class: Contaminated

## 2016-10-22 MED ORDER — LIDOCAINE HCL (CARDIAC) 20 MG/ML IV SOLN
INTRAVENOUS | Status: DC | PRN
  Administered 2016-10-22: 50 mg via INTRAVENOUS

## 2016-10-22 MED ORDER — SODIUM CHLORIDE 0.9 % IV SOLN: INTRAVENOUS | Status: DC

## 2016-10-22 MED ORDER — PROMETHAZINE HCL 25 MG/ML IJ SOLN: 6.2500 mg | INTRAMUSCULAR | Status: DC | PRN

## 2016-10-22 MED ORDER — PROPOFOL 10 MG/ML IV BOLUS
INTRAVENOUS | Status: DC | PRN
  Administered 2016-10-22: 10 mg via INTRAVENOUS
  Administered 2016-10-22: 20 mg via INTRAVENOUS
  Administered 2016-10-22: 10 mg via INTRAVENOUS
  Administered 2016-10-22: 20 mg via INTRAVENOUS
  Administered 2016-10-22: 100 mg via INTRAVENOUS
  Administered 2016-10-22: 30 mg via INTRAVENOUS
  Administered 2016-10-22 (×2): 10 mg via INTRAVENOUS
  Administered 2016-10-22 (×2): 20 mg via INTRAVENOUS
  Administered 2016-10-22: 10 mg via INTRAVENOUS
  Administered 2016-10-22: 20 mg via INTRAVENOUS
  Administered 2016-10-22: 10 mg via INTRAVENOUS
  Administered 2016-10-22 (×2): 20 mg via INTRAVENOUS
  Administered 2016-10-22 (×2): 10 mg via INTRAVENOUS
  Administered 2016-10-22 (×2): 20 mg via INTRAVENOUS

## 2016-10-22 MED ORDER — FENTANYL CITRATE (PF) 100 MCG/2ML IJ SOLN: 25.0000 ug | INTRAMUSCULAR | Status: DC | PRN

## 2016-10-22 MED ORDER — LACTATED RINGERS IV SOLN
10.0000 mL/h | INTRAVENOUS | Status: DC
  Administered 2016-10-22: 10 mL/h via INTRAVENOUS

## 2016-10-22 MED ORDER — OXYCODONE HCL 5 MG/5ML PO SOLN: 5.0000 mg | Freq: Once | ORAL | Status: DC | PRN

## 2016-10-22 MED ORDER — OXYCODONE HCL 5 MG PO TABS: 5.0000 mg | ORAL_TABLET | Freq: Once | ORAL | Status: DC | PRN

## 2016-10-22 MED ORDER — MEPERIDINE HCL 25 MG/ML IJ SOLN: 6.2500 mg | INTRAMUSCULAR | Status: DC | PRN

## 2016-10-22 MED ORDER — GLYCOPYRROLATE 0.2 MG/ML IJ SOLN
INTRAMUSCULAR | Status: DC | PRN
  Administered 2016-10-22: 0.1 mg via INTRAVENOUS

## 2016-10-22 SURGICAL SUPPLY — 35 items
BALLN DILATOR 10-12 8 (BALLOONS)
BALLN DILATOR 12-15 8 (BALLOONS)
BALLN DILATOR 15-18 8 (BALLOONS)
BALLN DILATOR CRE 0-12 8 (BALLOONS)
BALLN DILATOR ESOPH 8 10 CRE (MISCELLANEOUS) IMPLANT
BALLOON DILATOR 12-15 8 (BALLOONS) IMPLANT
BALLOON DILATOR 15-18 8 (BALLOONS) IMPLANT
BALLOON DILATOR CRE 0-12 8 (BALLOONS) IMPLANT
BLOCK BITE 60FR ADLT L/F GRN (MISCELLANEOUS) ×4 IMPLANT
CANISTER SUCT 1200ML W/VALVE (MISCELLANEOUS) ×4 IMPLANT
CLIP HMST 235XBRD CATH ROT (MISCELLANEOUS) IMPLANT
CLIP RESOLUTION 360 11X235 (MISCELLANEOUS)
FCP ESCP3.2XJMB 240X2.8X (MISCELLANEOUS)
FORCEPS BIOP RAD 4 LRG CAP 4 (CUTTING FORCEPS) IMPLANT
FORCEPS BIOP RJ4 240 W/NDL (MISCELLANEOUS)
FORCEPS ESCP3.2XJMB 240X2.8X (MISCELLANEOUS) IMPLANT
GOWN CVR UNV OPN BCK APRN NK (MISCELLANEOUS) ×4 IMPLANT
GOWN ISOL THUMB LOOP REG UNIV (MISCELLANEOUS) ×4
INJECTOR VARIJECT VIN23 (MISCELLANEOUS) IMPLANT
KIT DEFENDO VALVE AND CONN (KITS) IMPLANT
KIT ENDO PROCEDURE OLY (KITS) ×4 IMPLANT
MARKER SPOT ENDO TATTOO 5ML (MISCELLANEOUS) IMPLANT
PAD GROUND ADULT SPLIT (MISCELLANEOUS) IMPLANT
PROBE APC STR FIRE (PROBE) IMPLANT
RETRIEVER NET PLAT FOOD (MISCELLANEOUS) IMPLANT
RETRIEVER NET ROTH 2.5X230 LF (MISCELLANEOUS) IMPLANT
SNARE SHORT THROW 13M SML OVAL (MISCELLANEOUS) ×4 IMPLANT
SNARE SHORT THROW 30M LRG OVAL (MISCELLANEOUS) IMPLANT
SNARE SNG USE RND 15MM (INSTRUMENTS) IMPLANT
SPOT EX ENDOSCOPIC TATTOO (MISCELLANEOUS)
SYR INFLATION 60ML (SYRINGE) IMPLANT
TRAP ETRAP POLY (MISCELLANEOUS) IMPLANT
VARIJECT INJECTOR VIN23 (MISCELLANEOUS)
WATER STERILE IRR 250ML POUR (IV SOLUTION) ×4 IMPLANT
WIRE CRE 18-20MM 8CM F G (MISCELLANEOUS) IMPLANT

## 2016-10-22 NOTE — Op Note (Signed)
[facility] Patient Name: Denise Moore Procedure Date: 10/22/2016 7:43 AM MRN: [id number] Account #: [id number] Date of Birth: [birth year] Admit Type: Outpatient Age: 71 Room: [facility] OR ROOM 01 Gender: Female Note Status: Finalized Procedure:            Colonoscopy Indications:          Disease activity assessment of chronic ulcerative                        pancolitis Providers:            Lin Landsman MD, MD Referring MD:         Ocie Cornfield. Ouida Sills MD, MD (Referring MD) Medicines:            Monitored Anesthesia Care Complications:        No immediate complications. Estimated blood loss:                        Minimal. Procedure:            Pre-Anesthesia Assessment:                       - Prior to the procedure, a History and Physical was                        performed, and patient medications and allergies were                        reviewed. The patient is competent. The risks and                        benefits of the procedure and the sedation options and                        risks were discussed with the patient. All questions                        were answered and informed consent was obtained.                        Patient identification and proposed procedure were                        verified by the physician, the nurse, the                        anesthesiologist, the anesthetist and the technician in                        the pre-procedure area in the procedure room. Mental                        Status Examination: alert and oriented. Airway                        Examination: normal oropharyngeal airway and neck                        mobility. Respiratory Examination: clear to  auscultation. CV Examination: normal. Prophylactic                        Antibiotics: The patient does not require prophylactic                        antibiotics. Prior Anticoagulants: The patient has                         taken no previous anticoagulant or antiplatelet agents.                        ASA Grade Assessment: II - A patient with mild systemic                        disease. After reviewing the risks and benefits, the                        patient was deemed in satisfactory condition to undergo                        the procedure. The anesthesia plan was to use monitored                        anesthesia care (MAC). Immediately prior to                        administration of medications, the patient was                        re-assessed for adequacy to receive sedatives. The                        heart rate, respiratory rate, oxygen saturations, blood                        pressure, adequacy of pulmonary ventilation, and                        response to care were monitored throughout the                        procedure. The physical status of the patient was                        re-assessed after the procedure.                       After obtaining informed consent, the colonoscope was                        passed under direct vision. Throughout the procedure,                        the patient's blood pressure, pulse, and oxygen                        saturations were monitored continuously. The Olympus                        190 Colonoscope (  Z#[id number]) was introduced through the                        anus and advanced to the the terminal ileum. The                        colonoscopy was performed without difficulty. The                        patient tolerated the procedure well. The quality of                        the bowel preparation was adequate. Findings:      The perianal exam findings include non-thrombosed external hemorrhoids       and non-thrombosed internal hemorrhoids.      The terminal ileum appeared normal.      Inflammation was found in a continuous and circumferential pattern from       the rectum to the cecum. This was graded as Mayo Score 3 (severe, with        spontaneous bleeding, ulcerations), and when compared to the previous       examination, the findings are unchanged. Random biopsies were taken with       a cold forceps for histology to r/o CMV. Impression:           - Non-thrombosed external hemorrhoids and                        non-thrombosed internal hemorrhoids found on perianal                        exam.                       - The examined portion of the ileum was normal.                       - Severe (Mayo Score 3) pancolitis ulcerative colitis.                        The findings are unchanged since the last examination.                        Biopsied. Recommendation:       - Discharge patient to home.                       - Resume regular diet today.                       - Await pathology results.                       - Continue present medications.                       - Reccomend anti-TNF or entyvio                       - Follow up in GI clinic                       - Conitnue prednisone Procedure Code(s):    ---  Professional ---                       [phone], Colonoscopy, flexible; with biopsy, single or                        multiple Diagnosis Code(s):    --- Professional ---                       K64.4, Residual hemorrhoidal skin tags                       K64.8, Other hemorrhoids                       K51.00, Ulcerative (chronic) pancolitis without                        complications CPT copyright 2016 American Medical Association. All rights reserved. The codes documented in this report are preliminary and upon coder review may  be revised to meet current compliance requirements. Dr. Ulyess Mort Lin Landsman MD, MD 10/22/2016 8:30:53 AM This report has been signed electronically. Number of Addenda: 0 Note Initiated On: 10/22/2016 7:43 AM Scope Withdrawal Time: 0 hours 11 minutes 22 seconds  Total Procedure Duration: 0 hours 14 minutes 40 seconds       [facility]

## 2016-10-22 NOTE — Telephone Encounter (Signed)
-----   Message from Lin Landsman, MD sent at 10/22/2016  8:39 AM EDT ----- Regarding: severe UC She has severe UC on colonoscopy. Please find out the status on entyvio and humira.  Thanks RV

## 2016-10-22 NOTE — Transfer of Care (Signed)
Immediate Anesthesia Transfer of Care Note  Patient: Denise Moore  Procedure(s) Performed: Procedure(s): ESOPHAGOGASTRODUODENOSCOPY (EGD) (N/A) COLONOSCOPY (N/A)  Patient Location: PACU  Anesthesia Type: General  Level of Consciousness: awake, alert  and patient cooperative  Airway and Oxygen Therapy: Patient Spontanous Breathing and Patient connected to supplemental oxygen  Post-op Assessment: Post-op Vital signs reviewed, Patient's Cardiovascular Status Stable, Respiratory Function Stable, Patent Airway and No signs of Nausea or vomiting  Post-op Vital Signs: Reviewed and stable  Complications: No apparent anesthesia complications

## 2016-10-22 NOTE — Anesthesia Procedure Notes (Signed)
Date/Time: 10/22/2016 7:43 AM Performed by: Cameron Ali Pre-anesthesia Checklist: Patient identified, Emergency Drugs available, Suction available, Timeout performed and Patient being monitored Patient Re-evaluated:Patient Re-evaluated prior to induction Oxygen Delivery Method: Nasal cannula Placement Confirmation: positive ETCO2

## 2016-10-22 NOTE — Telephone Encounter (Signed)
Patient does not want Entyvio because it is an infusion.  She does prefer Humira however her insurance benefits will change in Oct becoming effective in January.  So she would like to continue doing what she is doing and hold off until January.  Thanks [employer]

## 2016-10-22 NOTE — H&P (Signed)
Cephas Darby, MD [address]  [place]  [place] [facility] [place], [place] [address]  Main: [phone]  Fax: [phone] Pager: [phone]  Primary Care Physician:  Kirk Ruths, MD Primary Gastroenterologist:  Dr. Cephas Darby  Pre-Procedure History & Physical: HPI:  Denise Moore is a 71 y.o. female is here for an endoscopy and colonoscopy.   Past Medical History:  Diagnosis Date  . Chronic ulcerative colitis ([place]) 01/22/2015   Last Assessment & Plan:  Post decadron treatment last month. Off now and feeling great.    Marland Kitchen GERD (gastroesophageal reflux disease)   . Pancreatic insufficiency 03/07/2015   Last Assessment & Plan:  On her pancrease supplement.    . Pure hypercholesterolemia 10/23/2014   Last Assessment & Plan:  On diet for now   . Wears upper dentures     Past Surgical History:  Procedure Laterality Date  . COLONOSCOPY  11/08/2014  . fistua repair  04/07/1956  . PILONIDAL CYST EXCISION  10/21/1965    Prior to Admission medications   Medication Sig Start Date End Date Taking? Authorizing Provider  mesalamine (APRISO) 0.375 g 24 hr capsule TAKE 4 CAPSULES(1.5 GRAMS) BY MOUTH EVERY Moore 06/03/16  Yes Lucilla Lame, MD  pantoprazole (PROTONIX) 40 MG tablet Take 1 tablet (40 mg total) by mouth daily. 02/26/16 02/25/17 Yes Lucilla Lame, MD  predniSONE (DELTASONE) 10 MG tablet Take 3 tablets (30 mg total) by mouth daily with breakfast. 27m for 2weeks, then 283mdaily 10/15/16  Yes Chantal Worthey, RoTally DueMD  balsalazide (COLAZAL) 750 MG capsule Take 3 capsules (2,250 mg total) by mouth 3 (three) times daily. Patient not taking: Reported on 10/15/2016 10/02/16 11/01/16  VaLin LandsmanMD  lipase/protease/amylase (CREON) 36000 UNITS CPEP capsule 2 capsules during each meal and 1 cap during each snack Patient not taking: Reported on 10/15/2016 10/14/16   VaLin LandsmanMD    Allergies as of 10/03/2016 - Review Complete 10/02/2016  Allergen Reaction Noted  . Other Itching  09/08/2014  . Penicillins Rash 03/07/2015    Family History  Problem Relation Age of Onset  . Cancer Mother   . Diabetes Father   . Heart disease Father     Social History   Social History  . Marital status: Widowed    Spouse name: N/A  . Number of children: N/A  . Years of education: N/A   Occupational History  . Not on file.   Social History Main Topics  . Smoking status: Current Every Moore Smoker    Packs/Moore: 0.50    Years: 50.00    Types: Cigarettes  . Smokeless tobacco: Never Used  . Alcohol use No  . Drug use: No  . Sexual activity: Not on file   Other Topics Concern  . Not on file   Social History Narrative  . No narrative on file    Review of Systems: See HPI, otherwise negative ROS  Physical Exam: BP (!) 157/74   Pulse 74   Temp 97.7 F (36.5 C) (Temporal)   Resp 16   Ht 5' 4"  (1.626 m)   Wt 113 lb (51.3 kg)   SpO2 96%   BMI 19.40 kg/m  General:   Alert,  pleasant and cooperative in NAD Head:  Normocephalic and atraumatic. Neck:  Supple; no masses or thyromegaly. Lungs:  Clear throughout to auscultation.    Heart:  Regular rate and rhythm. Abdomen:  Soft, nontender and nondistended. Normal bowel sounds, without guarding, and without rebound.  Neurologic:  Alert and  oriented x4;  grossly normal neurologically.  Impression/Plan: Denise Moore is here for an endoscopy and colonoscopy to be performed for elevated serum ttg, rule out celiac disease, restage ulcerative colitis  Risks, benefits, limitations, and alternatives regarding  endoscopy and colonoscopy have been reviewed with the patient.  Questions have been answered.  All parties agreeable.   Sherri Sear, MD  10/22/2016, 7:24 AM

## 2016-10-22 NOTE — Op Note (Signed)
[facility] Gastroenterology Patient Name: Gregg Holster Procedure Date: 10/22/2016 7:42 AM MRN: [id number] Account #: [id number] Date of Birth: [birth year] Admit Type: Outpatient Age: 71 Room: [facility] OR ROOM 01 Gender: Female Note Status: Finalized Procedure:            Upper GI endoscopy Indications:          elevated serum ttg levels, rule out celiac Providers:            Lin Landsman MD, MD Referring MD:         Ocie Cornfield. Ouida Sills MD, MD (Referring MD) Medicines:            Monitored Anesthesia Care Complications:        No immediate complications. Estimated blood loss:                        Minimal. Procedure:            Pre-Anesthesia Assessment:                       - Prior to the procedure, a History and Physical was                        performed, and patient medications and allergies were                        reviewed. The patient is competent. The risks and                        benefits of the procedure and the sedation options and                        risks were discussed with the patient. All questions                        were answered and informed consent was obtained.                        Patient identification and proposed procedure were                        verified by the physician, the nurse, the                        anesthesiologist, the anesthetist and the technician in                        the pre-procedure area in the procedure room. Mental                        Status Examination: alert and oriented. Airway                        Examination: normal oropharyngeal airway and neck                        mobility. Respiratory Examination: clear to                        auscultation. CV Examination: normal. Prophylactic  Antibiotics: The patient does not require prophylactic                        antibiotics. Prior Anticoagulants: The patient has                        taken no previous  anticoagulant or antiplatelet agents.                        ASA Grade Assessment: II - A patient with mild systemic                        disease. After reviewing the risks and benefits, the                        patient was deemed in satisfactory condition to undergo                        the procedure. The anesthesia plan was to use monitored                        anesthesia care (MAC). Immediately prior to                        administration of medications, the patient was                        re-assessed for adequacy to receive sedatives. The                        heart rate, respiratory rate, oxygen saturations, blood                        pressure, adequacy of pulmonary ventilation, and                        response to care were monitored throughout the                        procedure. The physical status of the patient was                        re-assessed after the procedure.                       After obtaining informed consent, the endoscope was                        passed under direct vision. Throughout the procedure,                        the patient's blood pressure, pulse, and oxygen                        saturations were monitored continuously. The Olympus                        190 Endoscope [id number]) was introduced through the  mouth, and advanced to the second part of duodenum. The                        upper GI endoscopy was accomplished without difficulty.                        The patient tolerated the procedure well. Findings:      The duodenal bulb and second portion of the duodenum were normal.       Biopsies for histology were taken with a cold forceps for evaluation of       celiac disease.      Diffuse mildly erythematous mucosa without bleeding was found in the       gastric antrum.      The cardia, gastric fundus and gastric body were normal. Biopsies were       taken with a cold forceps for Helicobacter pylori  testing.      The Z-line was irregular. Mucosa was biopsied with a cold forceps for       histology. One specimen bottle was sent to pathology.      The examined esophagus was normal. Impression:           - Normal duodenal bulb and second portion of the                        duodenum. Biopsied.                       - Erythematous mucosa in the antrum.                       - Normal cardia, gastric fundus and gastric body.                        Biopsied.                       - Z-line irregular. Biopsied.                       - Normal esophagus. Recommendation:       - Await pathology results.                       - Proceed with colonoscopy Procedure Code(s):    --- Professional ---                       [phone], Esophagogastroduodenoscopy, flexible, transoral;                        with biopsy, single or multiple Diagnosis Code(s):    --- Professional ---                       K31.89, Other diseases of stomach and duodenum                       K22.8, Other specified diseases of esophagus CPT copyright 2016 American Medical Association. All rights reserved. The codes documented in this report are preliminary and upon coder review may  be revised to meet current compliance requirements. Dr. Ulyess Mort Lin Landsman MD, MD 10/22/2016 8:05:22 AM This report has been signed electronically. Number of Addenda:  0 Note Initiated On: 10/22/2016 7:42 AM      [facility]

## 2016-10-22 NOTE — Anesthesia Preprocedure Evaluation (Signed)
Anesthesia Evaluation  Patient identified by MRN, date of birth, ID band Patient awake    Reviewed: Allergy & Precautions, H&P , NPO status , Patient's Chart, lab work & pertinent test results, reviewed documented beta blocker date and time   Airway Mallampati: II  TM Distance: >3 FB Neck ROM: full    Dental no notable dental hx. (+) Upper Dentures   Pulmonary neg pulmonary ROS, Current Smoker,    Pulmonary exam normal breath sounds clear to auscultation       Cardiovascular Exercise Tolerance: Good negative cardio ROS   Rhythm:regular Rate:Normal     Neuro/Psych negative neurological ROS  negative psych ROS   GI/Hepatic Neg liver ROS, PUD, GERD  ,Chronic ulcerative colitis    Endo/Other  negative endocrine ROS  Renal/GU negative Renal ROS  negative genitourinary   Musculoskeletal   Abdominal   Peds  Hematology negative hematology ROS (+)   Anesthesia Other Findings   Reproductive/Obstetrics negative OB ROS                             Anesthesia Physical Anesthesia Plan  ASA: II  Anesthesia Plan: General   Post-op Pain Management:    Induction:   PONV Risk Score and Plan:   Airway Management Planned:   Additional Equipment:   Intra-op Plan:   Post-operative Plan:   Informed Consent: I have reviewed the patients History and Physical, chart, labs and discussed the procedure including the risks, benefits and alternatives for the proposed anesthesia with the patient or authorized representative who has indicated his/her understanding and acceptance.   Dental Advisory Given  Plan Discussed with: CRNA  Anesthesia Plan Comments:         Anesthesia Quick Evaluation

## 2016-10-22 NOTE — Anesthesia Postprocedure Evaluation (Signed)
Anesthesia Post Note  Patient: Denise Moore  Procedure(s) Performed: Procedure(s) (LRB): ESOPHAGOGASTRODUODENOSCOPY (EGD) (N/A) COLONOSCOPY (N/A)  Patient location during evaluation: PACU Anesthesia Type: General Level of consciousness: awake and alert Pain management: pain level controlled Vital Signs Assessment: post-procedure vital signs reviewed and stable Respiratory status: spontaneous breathing, nonlabored ventilation, respiratory function stable and patient connected to nasal cannula oxygen Cardiovascular status: blood pressure returned to baseline and stable Postop Assessment: no apparent nausea or vomiting Anesthetic complications: no    Tipton Ballow ELAINE

## 2016-10-23 ENCOUNTER — Telehealth: Payer: Self-pay

## 2016-10-23 ENCOUNTER — Encounter: Payer: Self-pay | Admitting: Gastroenterology

## 2016-10-23 NOTE — Telephone Encounter (Signed)
Patient stopped by the office signed referral for Entyvio and Humira.  Humira information has been sent to [facility], Weyman Rodney has been sent to [name].  Thanks [employer]

## 2016-10-28 ENCOUNTER — Encounter: Payer: Self-pay | Admitting: Gastroenterology

## 2016-10-28 ENCOUNTER — Telehealth: Payer: Self-pay

## 2016-10-28 NOTE — Telephone Encounter (Signed)
Prior authorization has been sent via cover my meds for Entyvio.

## 2016-10-29 ENCOUNTER — Telehealth: Payer: Self-pay

## 2016-10-29 NOTE — Telephone Encounter (Signed)
Benefit Summary received for Humira: Total cost $6,600 the first month will be $2,850, monthly cost $250 remainder of the year.  Left voice message for pt to call me to discuss.  Thanks [employer]

## 2016-10-31 ENCOUNTER — Telehealth: Payer: Self-pay | Admitting: Gastroenterology

## 2016-10-31 NOTE — Telephone Encounter (Signed)
Please call Pam [phone] regarding auth on Entivio.

## 2016-11-04 ENCOUNTER — Telehealth: Payer: Self-pay

## 2016-11-04 NOTE — Telephone Encounter (Signed)
Patient stated that she is feeling great the Creon did help however she did not qualify for prescription assistance.  She has found an OTC enzyme Lipanase which has worked really really well. She is still on the Prednisone but feels the new OTC enzymes has been beneficial.  She has an appt on the 12th for follow up. Thanks [employer]

## 2016-11-11 ENCOUNTER — Other Ambulatory Visit: Payer: Self-pay | Admitting: Gastroenterology

## 2016-11-11 DIAGNOSIS — K5010 Crohn's disease of large intestine without complications: Secondary | ICD-10-CM | POA: Insufficient documentation

## 2016-11-11 MED ORDER — ADALIMUMAB 40 MG/0.8ML ~~LOC~~ AJKT: 40.0000 mg | AUTO-INJECTOR | SUBCUTANEOUS | 3 refills | Status: DC

## 2016-11-11 MED ORDER — ADALIMUMAB 40 MG/0.8ML ~~LOC~~ AJKT: 40.0000 mg | AUTO-INJECTOR | SUBCUTANEOUS | 0 refills | Status: DC

## 2016-11-11 NOTE — Progress Notes (Signed)
Recent biopsy results from Colonoscopy are suggestive of Crohn's colitis due to presence of granulomas.   Cephas Darby, MD [address]  [place]  [place], [place] [address]  Main: [phone]  Fax: [phone] Pager: [phone]

## 2016-11-12 ENCOUNTER — Other Ambulatory Visit: Payer: Self-pay

## 2016-11-12 DIAGNOSIS — K5010 Crohn's disease of large intestine without complications: Secondary | ICD-10-CM

## 2016-11-12 DIAGNOSIS — K50919 Crohn's disease, unspecified, with unspecified complications: Secondary | ICD-10-CM

## 2016-11-12 MED ORDER — ADALIMUMAB 40 MG/0.8ML ~~LOC~~ AJKT: 40.0000 mg | AUTO-INJECTOR | SUBCUTANEOUS | 0 refills | Status: DC

## 2016-11-12 MED ORDER — ADALIMUMAB 40 MG/0.8ML ~~LOC~~ AJKT
40.0000 mg | AUTO-INJECTOR | SUBCUTANEOUS | 0 refills | Status: DC
Start: 2016-11-12 — End: 2016-11-12

## 2016-11-12 MED ORDER — ADALIMUMAB 40 MG/0.8ML ~~LOC~~ AJKT: 40.0000 mg | AUTO-INJECTOR | SUBCUTANEOUS | 3 refills | Status: DC

## 2016-11-14 ENCOUNTER — Encounter: Payer: Self-pay | Admitting: Gastroenterology

## 2016-11-14 ENCOUNTER — Other Ambulatory Visit: Payer: Self-pay

## 2016-11-14 ENCOUNTER — Ambulatory Visit (INDEPENDENT_AMBULATORY_CARE_PROVIDER_SITE_OTHER): Payer: Medicare Other | Admitting: Gastroenterology

## 2016-11-14 VITALS — BP 178/84 | HR 75 | Temp 98.0°F | Ht 64.0 in | Wt 118.8 lb

## 2016-11-14 DIAGNOSIS — K5010 Crohn's disease of large intestine without complications: Secondary | ICD-10-CM

## 2016-11-14 NOTE — Progress Notes (Signed)
Denise Darby, MD [address]  [place]  [place], [place] [address]  Main: [phone]  Fax: [phone]    Gastroenterology Consultation  Referring Provider:     Kirk Ruths, MD Primary Care Physician:  Denise Ruths, MD Primary Gastroenterologist:  Dr. Cephas Moore Reason for Consultation:     Ulcerative colitis        HPI:   Denise Moore is a 71 y.o. y/o female referred by Dr. Ouida Sills, Ocie Cornfield, MD  for consultation & management of Ulcerative colitis. She was previously seen by my partner, Dr.Wohl and prior to him by Dr.Rein. She is diagnosed with ulcerative colitis based on a colonoscopy in 04/15/14, detail history as described below. Currently, she just finished prednisone course about 5 days ago and having 2 loose bowel movements, without blood per day. She is taking Apriso 4 pills daily. She reports occasional urgency, denies fecal incontinence, nocturnal symptoms, weight loss. She does have mild lower abdominal pain. She denies nausea, vomiting, fever, chills. She does smoke about 1 pack a day since her husband passed away at age 87 from cardiac arrest but does not drink alcohol. She denies taking NSAIDs or any antibiotics.  One of her sons was deceased in 04-16-1999 from cardiac arrest. She lives by herself  She reports that, she has to pay about $6000 to her insurance company and she cannot afford that much amount. She is planning to change her insurance.  Follow-up visit 10/15/2016 She reports not feeling good, she thinks she has a flareup again, unable to tolerate by mouth due to nausea, has nonbloody mushy stools up to 8 per day associated with abdominal discomfort. She lost about 5 pounds since last visit. She had a CT abdomen pelvis which revealed colitis and pancreatic atrophy. She could not tolerate balsalazide as it causes stomach pain. She could not afford Zenpep due to co-pay of $400. Her insurance did not cover uceris. She is not interested in  entyvio either due to high co-pay  Follow-up visit 11/14/2016 Since last visit, she underwent EGD and a colonoscopy. Colonoscopy revealed moderate to severe pancolitis and pathology revealed noncaseating granulomas confirming Crohn's colitis. She is currently doing well. She finished her prednisone taper. She took 30 mg for 2 weeks and then 20 mg for one week. Stopped about 6 days ago. Currently having one formed bowel movement daily not associated with any blood. She started taking over-the-counter pancreatic enzyme supplements and she reports that her diarrhea resolved. She stopped taking pantoprazole after her EGD. She continues to take Apriso. She reports that she tried Creon and Zenpep again after stopping pantoprazole and that has helped with better tolerability. She is not having abdominal pain any more. She is very hesitant to start any immunosuppressive medication because she does not want to comment to high co-pay's. She brought with her today some papers regarding role of aurvedic medications.   IBD diagnosis: 11/08/2014   Disease course: About 1 year history of Nonbloody diarrhea associated with urgency, elevated leukocytosis, mild symptom improvement with Cipro and Flagyl. Recurrence of symptoms and worsening leukocytosis, and abdominal pain. She had very low fecal pancreatic elastase 80, other stool studies were normal. TTG was elevated and CRP was 7.8. Colonoscopy in 11/2014 showed diffuse mild to moderate inflammation throughout the entire colon and rectum was spared, pathology consistent with UC per records. She initially was seen by Dr.Rein in [place] [facility] and was prescribed Apriso 1.5gm daily but she did not take it  due to high cost. She then started seeing Dr.Wohl, at the time she was taking Apriso 1.5 g daily. She continued to have flareups. She used to experience severe diarrhea up to 12 per day, nocturnal, fatigue, abdominal pain. She had at least 4-5 flareups so far and she was  receiving prednisone 40 mg or 30 mg for 2 weeks at a time. Her symptoms respond very well to prednisone. CRP 2.5 on 08/12/2016, H. pylori stool antigen negative, fecal calprotectin was 295 on 08/12/2016. She is currently on pancreatic enzyme supplements and clinically in remission  Extra intestinal manifestations: None She has history of skin cancer several years ago, did not see dermatologist for follow-up IBD surgical history: None She had fistula in ano, left external fistula, underwent fistulotomy in 1968  Imaging:  MRE: None CTE: None SBFT: None  Procedures: EGD and colonoscopy 10/22/2016 - Normal duodenal bulb and second portion of the duodenum. Biopsied. - Erythematous mucosa in the antrum. - Normal cardia, gastric fundus and gastric body. Biopsied. - Z-line irregular. Biopsied. - Normal esophagus. - Non-thrombosed external hemorrhoids and non-thrombosed internal hemorrhoids found on perianal exam. - The examined portion of the ileum was normal. - Severe (Mayo Score 3) pancolitis ulcerative colitis. The findings are unchanged since the last examination. Biopsied.  Colonoscopy 11/08/2014 The perianal and digital rectal exam was normal. Diffuse moderate inflammation characterized by erosions, erythema, granularity was found in the sigmoid colon, in the descending colon, in the transverse colon, at the hepatic flexure and in the cecum. The severity was mild throughout slightly worse in the sigmoid and descending colon. Rectum appears to be spared. Internal hemorrhoids were found on the retroflexion. Many small and large mouth diverticula were found in the sigmoid colon. Pathology report not available  VCE not done  IBD medications Steroids: Prednisone responsive, well-tolerated 5-ASA: mesalamine Apriso 1.5 g daily Immunomodulators: AZA, methotrexate nave TPMT status unknown Biologics: Nave Anti TNFs: Anti Integrins: Ustekinumab: Tofactinib: Clinical trial:   Past  Medical History:  Diagnosis Date  . Chronic ulcerative colitis ([place]) 01/22/2015   Last Assessment & Plan:  Post decadron treatment last month. Off now and feeling great.    Marland Kitchen GERD (gastroesophageal reflux disease)   . Pancreatic insufficiency 03/07/2015   Last Assessment & Plan:  On her pancrease supplement.    . Pure hypercholesterolemia 10/23/2014   Last Assessment & Plan:  On diet for now   . Wears upper dentures     Past Surgical History:  Procedure Laterality Date  . COLONOSCOPY  11/08/2014  . COLONOSCOPY N/A 10/22/2016   Procedure: COLONOSCOPY;  Surgeon: Lin Landsman, MD;  Location: [facility];  Service: Gastroenterology;  Laterality: N/A;  . ESOPHAGOGASTRODUODENOSCOPY N/A 10/22/2016   Procedure: ESOPHAGOGASTRODUODENOSCOPY (EGD);  Surgeon: Lin Landsman, MD;  Location: [facility];  Service: Gastroenterology;  Laterality: N/A;  . fistua repair  04/07/1956  . PILONIDAL CYST EXCISION  10/21/1965    Prior to Admission medications   Medication Sig Start Date End Date Taking? Authorizing Provider  mesalamine (APRISO) 0.375 g 24 hr capsule TAKE 4 CAPSULES(1.5 GRAMS) BY MOUTH EVERY DAY 06/03/16  Yes Lucilla Lame, MD  pantoprazole (PROTONIX) 40 MG tablet Take 1 tablet (40 mg total) by mouth daily. 02/26/16 02/25/17 Yes Lucilla Lame, MD    Family History  Problem Relation Age of Onset  . Cancer Mother   . Diabetes Father   . Heart disease Father      Social History  Substance Use Topics  . Smoking  status: Current Every Day Smoker    Packs/day: 0.50    Years: 50.00    Types: Cigarettes  . Smokeless tobacco: Never Used  . Alcohol use No    Allergies as of 11/14/2016 - Review Complete 11/14/2016  Allergen Reaction Noted  . Other Itching 09/08/2014  . Penicillins Rash 03/07/2015    Review of Systems:    All systems reviewed and negative except where noted in HPI.   Physical Exam:  BP (!) 178/84   Pulse 75   Temp 98 F (36.7 C) (Oral)   Ht 5' 4"   (1.626 m)   Wt 118 lb 12.8 oz (53.9 kg)   BMI 20.39 kg/m  No LMP recorded. Patient is postmenopausal.  General:   Alert,  Well-developed, well-nourished, pleasant and cooperative in NAD Head:  Normocephalic and atraumatic. Eyes:  Sclera clear, no icterus.   Conjunctiva pink. Ears:  Normal auditory acuity. Nose:  No deformity, discharge, or lesions. Mouth:  Dental caries, dental implants, cavities, nontender Neck:  Supple; no masses or thyromegaly. Lungs:  Respirations even and unlabored.  Clear throughout to auscultation.   No wheezes, crackles, or rhonchi. No acute distress. Heart:  Regular rate and rhythm; no murmurs, clicks, rubs, or gallops. Abdomen:  Normal bowel sounds.  No bruits.  Soft, mild lower abdominal tenderness and non-distended without masses, hepatosplenomegaly or hernias noted.  No guarding or rebound tenderness.   Rectal: Not performed, perianal exam revealed skin tags and small external opening close to the anus, nontender and no fluctuant fluid collection appreciated Msk:  Symmetrical without gross deformities. Good, equal movement & strength bilaterally. Pulses:  Normal pulses noted. Extremities:  No clubbing or edema.  No cyanosis. Neurologic:  Alert and oriented x3;  grossly normal neurologically. Skin:  Intact without significant lesions or rashes. No jaundice. Lymph Nodes:  No significant cervical adenopathy. Psych:  Alert and cooperative. Normal mood and affect.   Assessment and Plan:   Amayia Ciano is a 71 y.o. y/o female with Initially diagnosed as ulcerative colitis but recent colonoscopy and biopsies revealed noncaseating granulomas conforming Crohn's colitis. Steroid responsive, exocrine pancreatic insufficiency, elevated serum TTG (negative for celiac disease on duodenal biopsies) currently on Apriso 1.5 g daily, currently in clinical remission   Crohn's colitis: Uncontrolled but clinically in remission, not on maintenance therapy due to significant  financial constraints. She cannot afford the co-pay of $1400 on Humira. She responds to intermittent courses of steroids  - She wants to continue Apriso  - I'll try getting approval for entyvio or Remicade  - I tried to educate her about the importance of long-term maintenance therapy with Biologics given the severity of her disease.   Exocrine pancreatic insufficiency: Likely secondary to chronic pancreatitis from tobacco use. CT pancreas protocol did not reveal any pancreatic lesions.  - Continue Creon 72,000 units each meal  - Strongly recommended to quit smoking  Elevated serum TTG: - EGD with biopsies no evidence of celiac disease - No further workup needed  IBD Health Maintenance  1.TB status: Gold quantiferon negative 2. Anemia: Not present. Hemoglobin 14.6, iron studies suggest normal ferritin 32  3.Immunizations:  she is immune to hepatitis A, hep B surface antigen and antibody are negative, recommend hepatitis B vaccine, Influenza declined, prevnar not received, agreed to receive, pneumovax received on 10/23/2014, Varicella as an adult, Zoster vaccine not received. Recommend shingrix vaccine 4.Cancer screening I) Colon cancer/dysplasia surveillance: N/A. Colonoscopy in 2018 with no evidence of dysplasia  II) Cervical cancer: not present  III) Skin cancer - counseled about annual skin exam by dermatology and skin protection in summer using sun screen SPF > 50, clothing 5.Bone health Vitamin D status: Unknown, start calcium and vitamin D supplements Bone density testing: Ordered, not scheduled yet  5. Labs: to date , ferritin below 50, recommend oral iron daily 6. Smoking: Need to quit smoking 7. NSAIDs and Antibiotics use: None  Follow up in 65month  RCephas Darby MD

## 2016-11-18 ENCOUNTER — Telehealth: Payer: Self-pay

## 2016-11-18 ENCOUNTER — Other Ambulatory Visit: Payer: Self-pay

## 2016-11-18 DIAGNOSIS — Z7952 Long term (current) use of systemic steroids: Secondary | ICD-10-CM

## 2016-11-18 NOTE — Telephone Encounter (Signed)
-----   Message from Lin Landsman, MD sent at 11/14/2016 12:47 PM EDT ----- Regarding: DEXA Please schedule a DEXA scan for her And, let's try approval for Remicade and stelara.   Thanks [employer]

## 2016-11-18 NOTE — Telephone Encounter (Signed)
Patient has been informed of her appt for DEXA Scan at [facility] for 12/17/16 at 9:40 am.  She does not want me to proceed with Stelara or Remicade.  Thanks [employer]

## 2016-11-24 DIAGNOSIS — K5080 Crohn's disease of both small and large intestine without complications: Secondary | ICD-10-CM | POA: Insufficient documentation

## 2016-12-03 ENCOUNTER — Telehealth: Payer: Self-pay

## 2016-12-03 ENCOUNTER — Other Ambulatory Visit: Payer: Self-pay

## 2016-12-03 NOTE — Telephone Encounter (Signed)
Contacted patient for a check in.  She said she is doing good.  She has resumed the Apriso and this is helping.   She was contacted by the [facility] Nurse to get financial assistance with medication.  They asked her to provide her son's income and her income.  The medication price would be $2,800 $250/month.  She said she is not comfortable providing this income information and she did not.  She thanked them for calling her.  Thanks [employer]

## 2016-12-17 ENCOUNTER — Ambulatory Visit
Admission: RE | Admit: 2016-12-17 | Discharge: 2016-12-17 | Disposition: A | Discharge status: Home / Self Care | Payer: Medicare Other | Source: Ambulatory Visit | Attending: Gastroenterology | Admitting: Gastroenterology

## 2016-12-17 DIAGNOSIS — M810 Age-related osteoporosis without current pathological fracture: Secondary | ICD-10-CM | POA: Diagnosis not present

## 2016-12-17 DIAGNOSIS — Z7952 Long term (current) use of systemic steroids: Secondary | ICD-10-CM | POA: Insufficient documentation

## 2016-12-19 ENCOUNTER — Other Ambulatory Visit: Payer: Self-pay | Admitting: Gastroenterology

## 2016-12-19 DIAGNOSIS — M818 Other osteoporosis without current pathological fracture: Secondary | ICD-10-CM

## 2016-12-22 ENCOUNTER — Other Ambulatory Visit
Admission: RE | Admit: 2016-12-22 | Discharge: 2016-12-22 | Disposition: A | Discharge status: Home / Self Care | Payer: Medicare Other | Source: Ambulatory Visit | Attending: Gastroenterology | Admitting: Gastroenterology

## 2016-12-22 DIAGNOSIS — M818 Other osteoporosis without current pathological fracture: Secondary | ICD-10-CM | POA: Diagnosis present

## 2016-12-23 ENCOUNTER — Other Ambulatory Visit: Payer: Self-pay | Admitting: Gastroenterology

## 2016-12-23 ENCOUNTER — Telehealth: Payer: Self-pay

## 2016-12-23 LAB — VITAMIN D 25 HYDROXY (VIT D DEFICIENCY, FRACTURES): Vit D, 25-Hydroxy: 21.6 ng/mL — ABNORMAL LOW (ref 30.0–100.0)

## 2016-12-23 MED ORDER — VITAMIN D (ERGOCALCIFEROL) 1.25 MG (50000 UNIT) PO CAPS: 50000.0000 [IU] | ORAL_CAPSULE | ORAL | 0 refills | Status: AC

## 2016-12-23 NOTE — Telephone Encounter (Signed)
Pt has been informed she has osteoporosis, She has declined referral her to endocrinology. Pt stated that she will pick up her rx vitamin D and eat more dairy.   Thanks [employer]

## 2017-05-12 ENCOUNTER — Telehealth: Payer: Self-pay

## 2017-05-12 NOTE — Telephone Encounter (Signed)
Contacted patient to check in to see how she was doing.  She states she is doing well.  She has been taking Apriso and has not had any issues.  She said she was going to call us because she is in need of a refill on her Apriso.  I explained to her that we would be more than happy to provide her with a refill since its working for her, however she has to come in for an office visit.  Her last visit with [place] was in November and since she has Crohn's we would like to follow up with her regularly.  She understood and has been scheduled for Thursday at 1:30 to see you.  Thanks [employer]

## 2017-05-12 NOTE — Telephone Encounter (Signed)
Contacted patient to see how she was doing. She stated she is doing

## 2017-05-14 ENCOUNTER — Encounter: Payer: Self-pay | Admitting: Gastroenterology

## 2017-05-14 ENCOUNTER — Ambulatory Visit (INDEPENDENT_AMBULATORY_CARE_PROVIDER_SITE_OTHER): Payer: Medicare Other | Admitting: Gastroenterology

## 2017-05-14 ENCOUNTER — Other Ambulatory Visit: Payer: Self-pay

## 2017-05-14 VITALS — BP 167/87 | HR 76 | Ht 64.0 in | Wt 118.0 lb

## 2017-05-14 DIAGNOSIS — K5010 Crohn's disease of large intestine without complications: Secondary | ICD-10-CM | POA: Diagnosis not present

## 2017-05-14 MED ORDER — MESALAMINE ER 0.375 G PO CP24: ORAL_CAPSULE | ORAL | 5 refills | Status: DC

## 2017-05-14 NOTE — Progress Notes (Signed)
Cephas Darby, MD [address]  [place]  [place], [place] [address]  Main: [phone]  Fax: [phone]    Gastroenterology Consultation  Referring Provider:     Kirk Ruths, MD Primary Care Physician:  Kirk Ruths, MD Primary Gastroenterologist:  Dr. Cephas Darby Reason for Consultation:    Crohn's colitis        HPI:   Denise Moore is a 72 y.o. female referred by Dr. Ouida Sills, Ocie Cornfield, MD  for consultation & management of Ulcerative colitis. She was previously seen by my partner, Dr.Wohl and prior to him by Dr.Rein. She is diagnosed with ulcerative colitis based on a colonoscopy in 04-06-2014, detail history as described below. Currently, she just finished prednisone course about 5 days ago and having 2 loose bowel movements, without blood per day. She is taking Apriso 4 pills daily. She reports occasional urgency, denies fecal incontinence, nocturnal symptoms, weight loss. She does have mild lower abdominal pain. She denies nausea, vomiting, fever, chills. She does smoke about 1 pack a day since her husband passed away at age 38 from cardiac arrest but does not drink alcohol. She denies taking NSAIDs or any antibiotics.  One of her sons was deceased in 1999-04-07 from cardiac arrest. She lives by herself  She reports that, she has to pay about $6000 to her insurance company and she cannot afford that much amount. She is planning to change her insurance.  Follow-up visit 10/15/2016 She reports not feeling good, she thinks she has a flareup again, unable to tolerate by mouth due to nausea, has nonbloody mushy stools up to 8 per day associated with abdominal discomfort. She lost about 5 pounds since last visit. She had a CT abdomen pelvis which revealed colitis and pancreatic atrophy. She could not tolerate balsalazide as it causes stomach pain. She could not afford Zenpep due to co-pay of $400. Her insurance did not cover uceris. She is not interested in entyvio  either due to high co-pay  Follow-up visit 11/14/2016 Since last visit, she underwent EGD and a colonoscopy. Colonoscopy revealed moderate to severe pancolitis and pathology revealed noncaseating granulomas confirming Crohn's colitis. She is currently doing well. She finished her prednisone taper. She took 30 mg for 2 weeks and then 20 mg for one week. Stopped about 6 days ago. Currently having one formed bowel movement daily not associated with any blood. She started taking over-the-counter pancreatic enzyme supplements and she reports that her diarrhea resolved. She stopped taking pantoprazole after her EGD. She continues to take Apriso. She reports that she tried Creon and Zenpep again after stopping pantoprazole and that has helped with better tolerability. She is not having abdominal pain any more. She is very hesitant to start any immunosuppressive medication because she does not want to commit to high co-pay's. She brought with her today some papers regarding role of aurvedic medications.   Follow-up visit 05/14/2017 Patient reports doing well without episodes of diarrhea, abdominal pain or blood in the stools since last visit. She is currently taking Apriso 1.5 g daily, she was taking 4 pills daily for about 2 months, decreased to 2 pills daily since 02/2017. She is taking herbal pancreatic enzymes. Her weight has been stable. She continues to smoke cigarettes. She denies fatigue, nausea, vomiting  IBD diagnosis: 11/08/2014   Disease course: About 1 year history of Nonbloody diarrhea associated with urgency, elevated leukocytosis, mild symptom improvement with Cipro and Flagyl. Recurrence of symptoms and worsening leukocytosis, and  abdominal pain. She had very low fecal pancreatic elastase 80, other stool studies were normal. TTG was elevated and CRP was 7.8. Colonoscopy in 11/2014 showed diffuse mild to moderate inflammation throughout the entire colon and rectum was spared, pathology consistent  with UC per records. She initially was seen by Dr.Rein in [facility] and was prescribed Apriso 1.5gm daily but she did not take it due to high cost. She then started seeing Dr.Wohl, at the time she was taking Apriso 1.5 g daily. She continued to have flareups. She used to experience severe diarrhea up to 12 per day, nocturnal, fatigue, abdominal pain. She had at least 4-5 flareups so far and she was receiving prednisone 40 mg or 30 mg for 2 weeks at a time. Her symptoms respond very well to prednisone. CRP 2.5 on 08/12/2016, H. pylori stool antigen negative, fecal calprotectin was 295 on 08/12/2016. She is currently on Apriso 1.5 g daily and in clinical remission  Extra intestinal manifestations: None She has history of skin cancer several years ago, did not see dermatologist for follow-up IBD surgical history: None She had fistula in ano, left external fistula, underwent fistulotomy in 1968  Imaging:  MRE: None CTE: None SBFT: None  Procedures: EGD and colonoscopy 10/22/2016 - Normal duodenal bulb and second portion of the duodenum. Biopsied. - Erythematous mucosa in the antrum. - Normal cardia, gastric fundus and gastric body. Biopsied. - Z-line irregular. Biopsied. - Normal esophagus. - Non-thrombosed external hemorrhoids and non-thrombosed internal hemorrhoids found on perianal exam. - The examined portion of the ileum was normal. - Severe (Mayo Score 3) pancolitis ulcerative colitis. The findings are unchanged since the last examination. Biopsied.  Colonoscopy 11/08/2014 The perianal and digital rectal exam was normal. Diffuse moderate inflammation characterized by erosions, erythema, granularity was found in the sigmoid colon, in the descending colon, in the transverse colon, at the hepatic flexure and in the cecum. The severity was mild throughout slightly worse in the sigmoid and descending colon. Rectum appears to be spared. Internal hemorrhoids were found on the retroflexion.  Many small and large mouth diverticula were found in the sigmoid colon. Pathology report not available  VCE not done  IBD medications Steroids: Prednisone responsive, well-tolerated 5-ASA: mesalamine Apriso 1.5 g daily Immunomodulators: AZA, methotrexate nave TPMT status unknown Biologics: Nave Anti TNFs: Anti Integrins: Ustekinumab: Tofactinib: Clinical trial:   Past Medical History:  Diagnosis Date  . Chronic ulcerative colitis ([facility]) 01/22/2015   Last Assessment & Plan:  Post decadron treatment last month. Off now and feeling great.    Marland Kitchen GERD (gastroesophageal reflux disease)   . Pancreatic insufficiency 03/07/2015   Last Assessment & Plan:  On her pancrease supplement.    . Pure hypercholesterolemia 10/23/2014   Last Assessment & Plan:  On diet for now   . Wears upper dentures     Past Surgical History:  Procedure Laterality Date  . COLONOSCOPY  11/08/2014  . COLONOSCOPY N/A 10/22/2016   Procedure: COLONOSCOPY;  Surgeon: Lin Landsman, MD;  Location: [facility];  Service: Gastroenterology;  Laterality: N/A;  . ESOPHAGOGASTRODUODENOSCOPY N/A 10/22/2016   Procedure: ESOPHAGOGASTRODUODENOSCOPY (EGD);  Surgeon: Lin Landsman, MD;  Location: [facility];  Service: Gastroenterology;  Laterality: N/A;  . fistua repair  04/07/1956  . PILONIDAL CYST EXCISION  10/21/1965     Current Outpatient Medications:  .  Cetirizine HCl (ZYRTEC ALLERGY) 10 MG CAPS, Take by mouth., Disp: , Rfl:  .  Cholecalciferol (VITAMIN D-1000 MAX ST) 1000 units tablet,  Take by mouth., Disp: , Rfl:  .  mesalamine (APRISO) 0.375 g 24 hr capsule, TAKE 4 CAPSULES(1.5 GRAMS) BY MOUTH EVERY DAY, Disp: 120 capsule, Rfl: 5 .  pantoprazole (PROTONIX) 40 MG tablet, Take 1 tablet (40 mg total) by mouth daily., Disp: 30 tablet, Rfl: 11 .  PRESCRIPTION MEDICATION, Take 1 tablet by mouth daily., Disp: , Rfl:  .  Probiotic CAPS, Take by mouth., Disp: , Rfl:    Family History  Problem  Relation Age of Onset  . Cancer Mother   . Diabetes Father   . Heart disease Father      Social History   Tobacco Use  . Smoking status: Current Every Day Smoker    Packs/day: 0.50    Years: 50.00    Pack years: 25.00    Types: Cigarettes  . Smokeless tobacco: Never Used  Substance Use Topics  . Alcohol use: No  . Drug use: No    Allergies as of 05/14/2017 - Review Complete 05/14/2017  Allergen Reaction Noted  . Other Itching 09/08/2014  . Penicillins Rash 03/07/2015    Review of Systems:    All systems reviewed and negative except where noted in HPI.   Physical Exam:  BP (!) 167/87   Pulse 76   Ht 5' 4"  (1.626 m)   Wt 118 lb (53.5 kg)   BMI 20.25 kg/m  No LMP recorded. Patient is postmenopausal.  General:   Alert,  Well-developed, well-nourished, pleasant and cooperative in NAD Head:  Normocephalic and atraumatic. Eyes:  Sclera clear, no icterus.   Conjunctiva pink. Ears:  Normal auditory acuity. Nose:  No deformity, discharge, or lesions. Mouth:  Dental caries, dental implants, cavities, nontender Neck:  Supple; no masses or thyromegaly. Lungs:  Respirations even and unlabored.  Clear throughout to auscultation.   No wheezes, crackles, or rhonchi. No acute distress. Heart:  Regular rate and rhythm; no murmurs, clicks, rubs, or gallops. Abdomen:  Normal bowel sounds.  No bruits.  Soft, mild lower abdominal tenderness and non-distended without masses, hepatosplenomegaly or hernias noted.  No guarding or rebound tenderness.   Rectal: Not performed, perianal exam revealed skin tags and small external opening close to the anus, nontender and no fluctuant fluid collection appreciated Msk:  Symmetrical without gross deformities. Good, equal movement & strength bilaterally. Pulses:  Normal pulses noted. Extremities:  No clubbing or edema.  No cyanosis. Neurologic:  Alert and oriented x3;  grossly normal neurologically. Skin:  Intact without significant lesions or  rashes. No jaundice. Lymph Nodes:  No significant cervical adenopathy. Psych:  Alert and cooperative. Normal mood and affect.   Assessment and Plan:   Denise Moore is a 72 y.o. present female who was Initially diagnosed as ulcerative colitis but colonoscopy and biopsies from 10/2016 revealed noncaseating granulomas conforming Crohn's colitis. Steroid responsive, exocrine pancreatic insufficiency, elevated serum TTG (negative for celiac disease on duodenal biopsies) currently on Apriso 1.5 g daily, currently in clinical remission   Crohn's colitis: Uncontrolled but in clinical remission, on maintenance therapy with Apriso 1.5 g daily. she does not want to be on biologics or immunomodulators secondary to financial constraints and she thinks Dorthy Cooler is working very well for her to keep her diarrhea under control.  - She wants to continue Apriso, recommended her to increase to 4 pills daily and maintain on it as this is the only medication that she is taking at present - I tried to educate her about the importance of long-term maintenance therapy with Biologics given  the severity of her disease.  - I discussed with her about this is monitoring with stool studies and blood tests. Patient is worried about the bills that she will end up paying for these blood tests and did not want me to order these today  Exocrine pancreatic insufficiency: Likely secondary to chronic pancreatitis from tobacco use. CT pancreas protocol did not reveal any pancreatic lesions.  - she wants to continue taking naturopathic pancreatic enzymes - Strongly recommended to quit smoking  Elevated serum TTG: - EGD with biopsies no evidence of celiac disease - No further workup needed  IBD Health Maintenance  1.TB status: Gold quantiferon negative 2. Anemia: Not present. Hemoglobin 14.6, iron studies suggest normal ferritin 32  3.Immunizations:  she is immune to hepatitis A, hep B surface antigen and antibody are negative,  recommend hepatitis B vaccine, Influenza declined, prevnar not received, agreed to receive, pneumovax received on 10/23/2014, Varicella as an adult, Zoster vaccine not received. Recommend shingrix vaccine 4.Cancer screening I) Colon cancer/dysplasia surveillance: N/A. Colonoscopy in 2018 with no evidence of dysplasia  II) Cervical cancer: not present III) Skin cancer - counseled about annual skin exam by dermatology and skin protection in summer using sun screen SPF > 50, clothing 5.Bone health Vitamin D status: Unknown, start calcium and vitamin D supplements Bone density testing: Ordered, not scheduled yet  5. Labs: to date , ferritin below 50, recommend oral iron daily 6. Smoking: Need to quit smoking 7. NSAIDs and Antibiotics use: None Refused to undergo any labs today  Follow up in 6 months  Cephas Darby, MD

## 2017-06-03 ENCOUNTER — Other Ambulatory Visit: Payer: Self-pay | Admitting: Gastroenterology

## 2018-03-31 ENCOUNTER — Other Ambulatory Visit: Payer: Self-pay | Admitting: Gastroenterology

## 2018-04-07 ENCOUNTER — Telehealth: Payer: Self-pay | Admitting: Gastroenterology

## 2018-04-07 NOTE — Telephone Encounter (Signed)
Please call pt and ask to schedule appt before medication can be refilled pt has not been seen in clinic since 05/14/2017

## 2018-04-07 NOTE — Telephone Encounter (Signed)
1. Which medications need to be refilled? (please list name of each medication and dose if known) Apriso 0.375 mg  2. Which pharmacy/location (including street and city if local pharmacy) is medication to be sent to?[facility] [place]   3. Do they need a 30 day or 90 day supply? [place]

## 2018-04-09 ENCOUNTER — Ambulatory Visit: Payer: Medicare Other | Admitting: Gastroenterology

## 2018-04-09 ENCOUNTER — Encounter: Payer: Self-pay | Admitting: Gastroenterology

## 2018-04-09 ENCOUNTER — Other Ambulatory Visit: Payer: Self-pay

## 2018-04-09 VITALS — BP 165/89 | HR 75 | Resp 17 | Ht 64.0 in | Wt 125.0 lb

## 2018-04-09 DIAGNOSIS — K5010 Crohn's disease of large intestine without complications: Secondary | ICD-10-CM

## 2018-04-09 MED ORDER — MESALAMINE ER 0.375 G PO CP24: ORAL_CAPSULE | ORAL | 2 refills | Status: DC

## 2018-04-09 NOTE — Progress Notes (Signed)
Cephas Darby, MD [address]  [place]  [place], [place] [address]  Main: [phone]  Fax: [phone]    Gastroenterology Consultation  Referring Provider:     Kirk Ruths, MD Primary Care Physician:  Kirk Ruths, MD Primary Gastroenterologist:  Dr. Cephas Darby Reason for Consultation:    Crohn's colitis        HPI:   Dezyre Hoefer is a 73 y.o. female referred by Dr. Ouida Sills, Ocie Cornfield, MD  for consultation & management of Ulcerative colitis. She was previously seen by my partner, Dr.Wohl and prior to him by Dr.Rein. She is diagnosed with ulcerative colitis based on a colonoscopy in 12-Apr-2014, detail history as described below. Currently, she just finished prednisone course about 5 days ago and having 2 loose bowel movements, without blood per day. She is taking Apriso 4 pills daily. She reports occasional urgency, denies fecal incontinence, nocturnal symptoms, weight loss. She does have mild lower abdominal pain. She denies nausea, vomiting, fever, chills. She does smoke about 1 pack a day since her husband passed away at age 21 from cardiac arrest but does not drink alcohol. She denies taking NSAIDs or any antibiotics.  One of her sons was deceased in 04/13/1999 from cardiac arrest. She lives by herself  She reports that, she has to pay about $6000 to her insurance company and she cannot afford that much amount. She is planning to change her insurance.  Follow-up visit 10/15/2016 She reports not feeling good, she thinks she has a flareup again, unable to tolerate by mouth due to nausea, has nonbloody mushy stools up to 8 per day associated with abdominal discomfort. She lost about 5 pounds since last visit. She had a CT abdomen pelvis which revealed colitis and pancreatic atrophy. She could not tolerate balsalazide as it causes stomach pain. She could not afford Zenpep due to co-pay of $400. Her insurance did not cover uceris. She is not interested in entyvio  either due to high co-pay  Follow-up visit 11/14/2016 Since last visit, she underwent EGD and a colonoscopy. Colonoscopy revealed moderate to severe pancolitis and pathology revealed noncaseating granulomas confirming Crohn's colitis. She is currently doing well. She finished her prednisone taper. She took 30 mg for 2 weeks and then 20 mg for one week. Stopped about 6 days ago. Currently having one formed bowel movement daily not associated with any blood. She started taking over-the-counter pancreatic enzyme supplements and she reports that her diarrhea resolved. She stopped taking pantoprazole after her EGD. She continues to take Apriso. She reports that she tried Creon and Zenpep again after stopping pantoprazole and that has helped with better tolerability. She is not having abdominal pain any more. She is very hesitant to start any immunosuppressive medication because she does not want to commit to high co-pay's. She brought with her today some papers regarding role of aurvedic medications.   Follow-up visit 05/14/2017 Patient reports doing well without episodes of diarrhea, abdominal pain or blood in the stools since last visit. She is currently taking Apriso 1.5 g daily, she was taking 4 pills daily for about 2 months, decreased to 2 pills daily since 02/2017. She is taking herbal pancreatic enzymes. Her weight has been stable. She continues to smoke cigarettes. She denies fatigue, nausea, vomiting  Follow-up visit 04/09/2018 She is here as 1 year follow-up of her Crohn's colitis.  She reports doing well, gained about 7 to 9 pounds last 1 year.  She reports having one  formed bowel movement daily without any rectal bleeding.  She denies abdominal pain, nausea or vomiting.  She is taking Apriso 3 to 4 pills daily.  She denies any complaints otherwise.  IBD diagnosis: 11/08/2014   Disease course: About 1 year history of Nonbloody diarrhea associated with urgency, elevated leukocytosis, mild symptom  improvement with Cipro and Flagyl. Recurrence of symptoms and worsening leukocytosis, and abdominal pain. She had very low fecal pancreatic elastase 80, other stool studies were normal. TTG was elevated and CRP was 7.8. Colonoscopy in 11/2014 showed diffuse mild to moderate inflammation throughout the entire colon and rectum was spared, pathology consistent with UC per records. She initially was seen by Dr.Rein in [facility] and was prescribed Apriso 1.5gm daily but she did not take it due to high cost. She then started seeing Dr.Wohl, at the time she was taking Apriso 1.5 g daily. She continued to have flareups. She used to experience severe diarrhea up to 12 per day, nocturnal, fatigue, abdominal pain. She had at least 4-5 flareups so far and she was receiving prednisone 40 mg or 30 mg for 2 weeks at a time. Her symptoms respond very well to prednisone. CRP 2.5 on 08/12/2016, H. pylori stool antigen negative, fecal calprotectin was 295 on 08/12/2016. She is currently on Apriso 1.5 g daily and in clinical remission  Extra intestinal manifestations: None She has history of skin cancer several years ago, did not see dermatologist for follow-up IBD surgical history: None She had fistula in ano, left external fistula, underwent fistulotomy in 1968  Imaging:  MRE: None CTE: None SBFT: None  Procedures: EGD and colonoscopy 10/22/2016 - Normal duodenal bulb and second portion of the duodenum. Biopsied. - Erythematous mucosa in the antrum. - Normal cardia, gastric fundus and gastric body. Biopsied. - Z-line irregular. Biopsied. - Normal esophagus. - Non-thrombosed external hemorrhoids and non-thrombosed internal hemorrhoids found on perianal exam. - The examined portion of the ileum was normal. - Severe (Mayo Score 3) pancolitis ulcerative colitis. The findings are unchanged since the last examination. Biopsied.  Colonoscopy 11/08/2014 The perianal and digital rectal exam was normal. Diffuse  moderate inflammation characterized by erosions, erythema, granularity was found in the sigmoid colon, in the descending colon, in the transverse colon, at the hepatic flexure and in the cecum. The severity was mild throughout slightly worse in the sigmoid and descending colon. Rectum appears to be spared. Internal hemorrhoids were found on the retroflexion. Many small and large mouth diverticula were found in the sigmoid colon. Pathology report not available  VCE not done  IBD medications Steroids: Prednisone responsive, well-tolerated 5-ASA: mesalamine Apriso 1.5 g daily Immunomodulators: AZA, methotrexate nave TPMT status unknown Biologics: Nave Anti TNFs: Anti Integrins: Ustekinumab: Tofactinib: Clinical trial:   Past Medical History:  Diagnosis Date  . Chronic ulcerative colitis ([place]) 01/22/2015   Last Assessment & Plan:  Post decadron treatment last month. Off now and feeling great.    Marland Kitchen GERD (gastroesophageal reflux disease)   . Pancreatic insufficiency 03/07/2015   Last Assessment & Plan:  On her pancrease supplement.    . Pure hypercholesterolemia 10/23/2014   Last Assessment & Plan:  On diet for now   . Wears upper dentures     Past Surgical History:  Procedure Laterality Date  . COLONOSCOPY  11/08/2014  . COLONOSCOPY N/A 10/22/2016   Procedure: COLONOSCOPY;  Surgeon: Lin Landsman, MD;  Location: [facility];  Service: Gastroenterology;  Laterality: N/A;  . ESOPHAGOGASTRODUODENOSCOPY N/A 10/22/2016   Procedure:  ESOPHAGOGASTRODUODENOSCOPY (EGD);  Surgeon: Lin Landsman, MD;  Location: [facility];  Service: Gastroenterology;  Laterality: N/A;  . fistua repair  04/07/1956  . PILONIDAL CYST EXCISION  10/21/1965     Current Outpatient Medications:  .  Cetirizine HCl (ZYRTEC ALLERGY) 10 MG CAPS, Take by mouth., Disp: , Rfl:  .  mesalamine (APRISO) 0.375 g 24 hr capsule, TAKE 4 CAPSULES(1.5 GRAMS) BY MOUTH EVERY DAY, Disp: 120 capsule, Rfl: 2 .   Probiotic CAPS, Take by mouth., Disp: , Rfl:  .  Cholecalciferol (VITAMIN D-1000 MAX ST) 1000 units tablet, Take by mouth., Disp: , Rfl:  .  pantoprazole (PROTONIX) 40 MG tablet, Take 1 tablet (40 mg total) by mouth daily., Disp: 30 tablet, Rfl: 11 .  PRESCRIPTION MEDICATION, Take 1 tablet by mouth daily., Disp: , Rfl:    Family History  Problem Relation Age of Onset  . Cancer Mother   . Diabetes Father   . Heart disease Father      Social History   Tobacco Use  . Smoking status: Current Every Day Smoker    Packs/day: 0.50    Years: 50.00    Pack years: 25.00    Types: Cigarettes  . Smokeless tobacco: Never Used  Substance Use Topics  . Alcohol use: No  . Drug use: No    Allergies as of 04/09/2018 - Review Complete 04/09/2018  Allergen Reaction Noted  . Other Itching 09/08/2014  . Penicillins Rash 03/07/2015    Review of Systems:    All systems reviewed and negative except where noted in HPI.   Physical Exam:  BP (!) 165/89 (BP Location: Left Arm, Patient Position: Sitting, Cuff Size: Normal)   Pulse 75   Resp 17   Ht 5' 4"  (1.626 m)   Wt 125 lb (56.7 kg)   BMI 21.46 kg/m  No LMP recorded. Patient is postmenopausal.  General:   Alert,  Well-developed, well-nourished, pleasant and cooperative in NAD Head:  Normocephalic and atraumatic. Eyes:  Sclera clear, no icterus.   Conjunctiva pink. Ears:  Normal auditory acuity. Nose:  No deformity, discharge, or lesions. Mouth:  Dental caries, dental implants, cavities, nontender Neck:  Supple; no masses or thyromegaly. Lungs:  Respirations even and unlabored.  Clear throughout to auscultation.   No wheezes, crackles, or rhonchi. No acute distress. Heart:  Regular rate and rhythm; no murmurs, clicks, rubs, or gallops. Abdomen:  Normal bowel sounds.  No bruits.  Soft, mild lower abdominal tenderness and non-distended without masses, hepatosplenomegaly or hernias noted.  No guarding or rebound tenderness.   Rectal: Not  performed, perianal exam revealed skin tags and small external opening close to the anus, nontender and no fluctuant fluid collection appreciated Msk:  Symmetrical without gross deformities. Good, equal movement & strength bilaterally. Pulses:  Normal pulses noted. Extremities:  No clubbing or edema.  No cyanosis. Neurologic:  Alert and oriented x3;  grossly normal neurologically. Skin:  Intact without significant lesions or rashes. No jaundice. Lymph Nodes:  No significant cervical adenopathy. Psych:  Alert and cooperative. Normal mood and affect.   Assessment and Plan:   Twana Wileman is a 73 y.o. present female who was Initially diagnosed as ulcerative colitis but colonoscopy and biopsies from 10/2016 revealed noncaseating granulomas conforming Crohn's colitis. Steroid responsive, exocrine pancreatic insufficiency, elevated serum TTG (negative for celiac disease on duodenal biopsies) currently on Apriso 1.5 g daily, currently in clinical remission   Crohn's colitis: Uncontrolled but in clinical remission, on maintenance therapy with Apriso  1.5 g daily. she does not want to be on biologics or immunomodulators secondary to financial constraints and she thinks Dorthy Cooler is working very well for her to keep her diarrhea under control.  - She wants to continue Apriso, 4 pills daily and maintain on it as this is the only medication that she is taking at present - Advised her to take curcumin capsules, 2 capsules 2 times a day along with Apriso - I tried to educate her about the importance of long-term maintenance therapy with Biologics given the severity of her disease.  - I discussed with her about this is monitoring with stool studies and blood tests. Patient is worried about the bills that she will end up paying for these blood tests and did not want me to order these today  Exocrine pancreatic insufficiency: Likely secondary to chronic pancreatitis from tobacco use. CT pancreas protocol did not  reveal any pancreatic lesions.  - she wants to continue taking naturopathic pancreatic enzymes - Strongly recommended to quit smoking  Elevated serum TTG: - EGD with biopsies no evidence of celiac disease - No further workup needed  IBD Health Maintenance  1.TB status: Gold quantiferon negative 2. Anemia: Not present. Hemoglobin 14.6, iron studies suggest normal ferritin 32  3.Immunizations:  she is immune to hepatitis A, hep B surface antigen and antibody are negative, recommend hepatitis B vaccine, Influenza declined, prevnar not received, agreed to receive, pneumovax received on 10/23/2014, Varicella as an adult, Zoster vaccine not received. Recommend shingrix vaccine 4.Cancer screening I) Colon cancer/dysplasia surveillance: N/A. Colonoscopy in 2018 with no evidence of dysplasia, recommend follow-up colonoscopy in 11/2018 to assess the severity of inflammation II) Cervical cancer: not present III) Skin cancer - counseled about annual skin exam by dermatology and skin protection in summer using sun screen SPF > 50, clothing 5.Bone health Vitamin D status: Unknown, start calcium and vitamin D supplements Bone density testing: Ordered, not scheduled yet  5. Labs: Recheck labs today, ferritin below 50, recommend oral iron daily 6. Smoking: Need to quit smoking 7. NSAIDs and Antibiotics use: None Refused to undergo any labs today  Follow up in 6 months  Cephas Darby, MD

## 2018-04-12 ENCOUNTER — Other Ambulatory Visit: Payer: Self-pay

## 2018-04-12 DIAGNOSIS — K5010 Crohn's disease of large intestine without complications: Secondary | ICD-10-CM

## 2018-04-13 LAB — CBC
HEMATOCRIT: 42.3 % (ref 34.0–46.6)
HEMOGLOBIN: 14.6 g/dL (ref 11.1–15.9)
MCH: 31.8 pg (ref 26.6–33.0)
MCHC: 34.5 g/dL (ref 31.5–35.7)
MCV: 92 fL (ref 79–97)
Platelets: 194 10*3/uL (ref 150–450)
RBC: 4.59 x10E6/uL (ref 3.77–5.28)
RDW: 12.2 % (ref 11.7–15.4)
WBC: 12 10*3/uL — AB (ref 3.4–10.8)

## 2018-04-13 LAB — COMPREHENSIVE METABOLIC PANEL
ALBUMIN: 4.9 g/dL — AB (ref 3.7–4.7)
ALK PHOS: 83 IU/L (ref 39–117)
ALT: 14 IU/L (ref 0–32)
AST: 17 IU/L (ref 0–40)
Albumin/Globulin Ratio: 2 (ref 1.2–2.2)
BUN / CREAT RATIO: 19 (ref 12–28)
BUN: 13 mg/dL (ref 8–27)
Bilirubin Total: 0.4 mg/dL (ref 0.0–1.2)
CHLORIDE: 100 mmol/L (ref 96–106)
CO2: 22 mmol/L (ref 20–29)
CREATININE: 0.69 mg/dL (ref 0.57–1.00)
Calcium: 9.8 mg/dL (ref 8.7–10.3)
GFR calc Af Amer: 101 mL/min/{1.73_m2} (ref >59)
GFR calc non Af Amer: 87 mL/min/{1.73_m2} (ref >59)
GLUCOSE: 91 mg/dL (ref 65–99)
Globulin, Total: 2.4 g/dL (ref 1.5–4.5)
Potassium: 4.7 mmol/L (ref 3.5–5.2)
Sodium: 140 mmol/L (ref 134–144)
Total Protein: 7.3 g/dL (ref 6.0–8.5)

## 2019-02-17 IMAGING — CT CT ABDOMEN WO/W CM
4 of 8 series · 14 of 32 positions shown, 17 images · IV contrast (isovue)
Comparison: None.

CLINICAL DATA: Intermittent nausea and mid abdominal pain since
10/02/2016. History of ulcerative colitis.

EXAM:
CT ABDOMEN WITHOUT AND WITH CONTRAST
TECHNIQUE: Multidetector CT imaging of the abdomen was performed following the
standard protocol before and following the bolus administration of
intravenous contrast.
CONTRAST:  100 cc Isovue 370

[Series 2: axial pre · axial · non-contrast · 0.70mm/px · z∈[-636,-580]mm · 2 of 33 slices shown]
[im 11/33  soft-tissue]
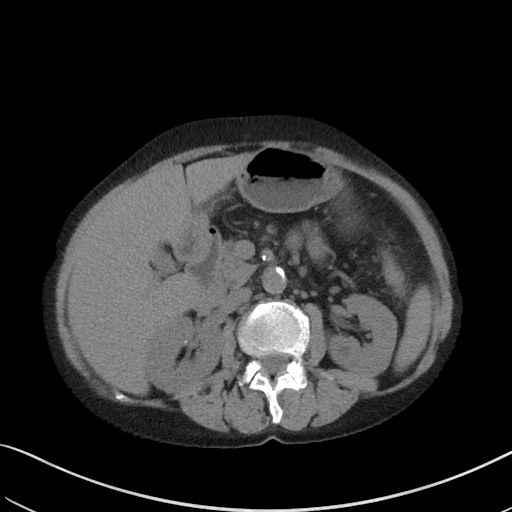
[im 22/33  soft-tissue]
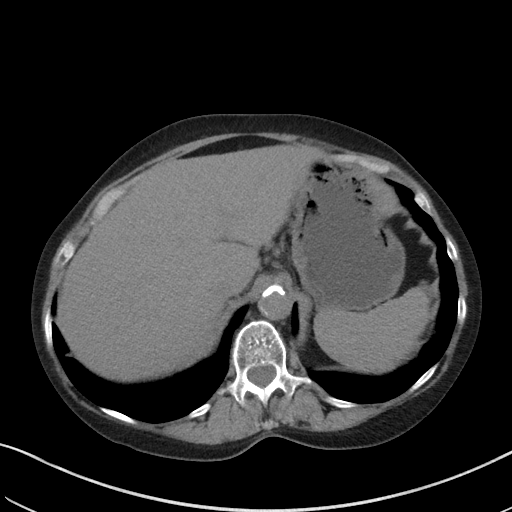

[Series 4: axial arterial · axial · arterial · 0.69mm/px · z∈[-706,-562]mm · 5 of 73 slices shown]
[im 13/73  soft-tissue]
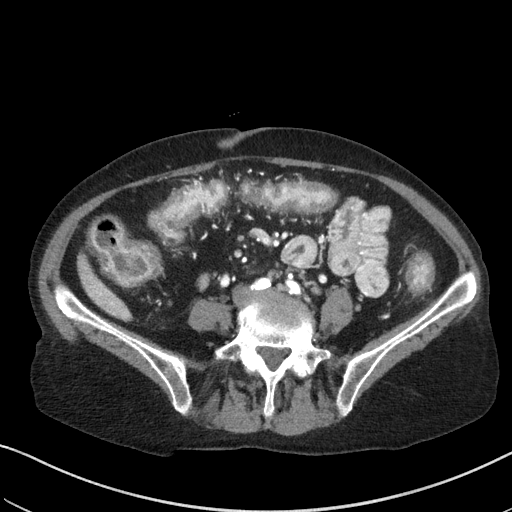
[im 25/73  soft-tissue]
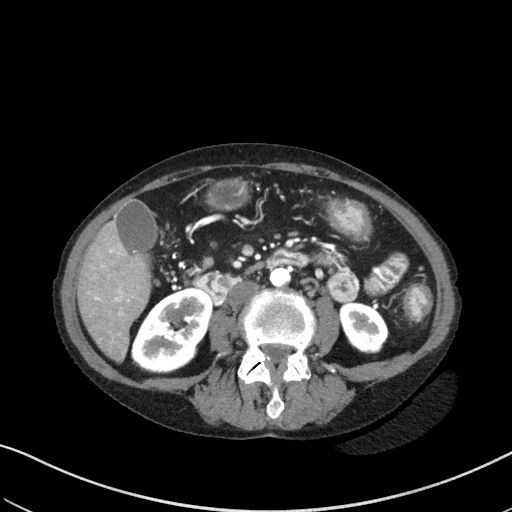
[im 37/73  soft-tissue]
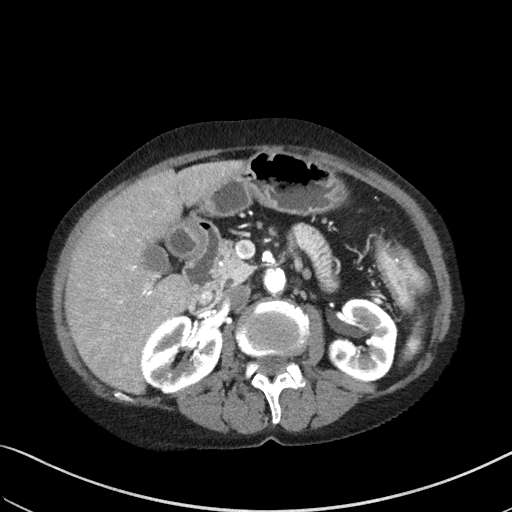
[im 49/73  soft-tissue]
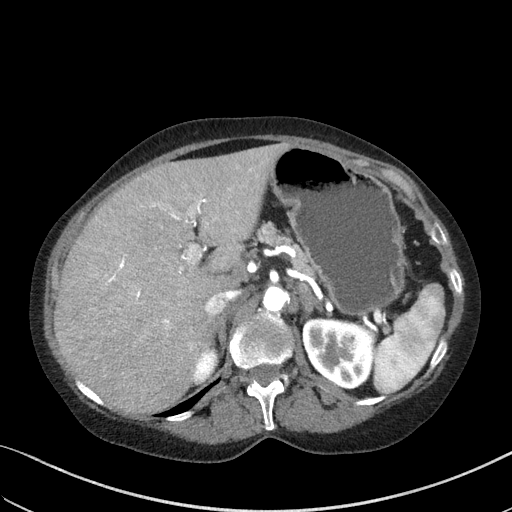
[im 61/73  soft-tissue]
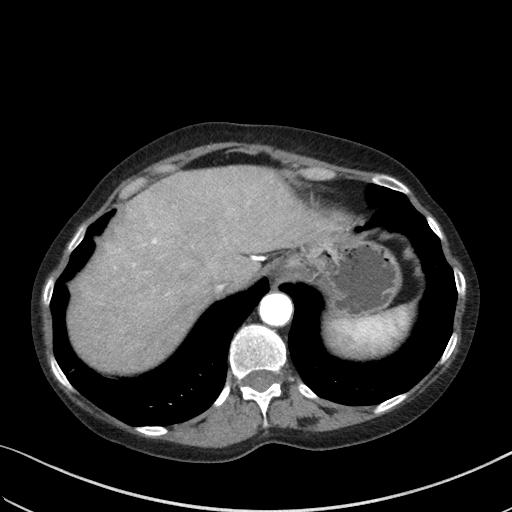

[Series 8: axial venous · axial · portal-venous · 0.69mm/px · z∈[-670,-600]mm · 2 of 44 slices shown, 5 images]
[im 15/44  soft-tissue]
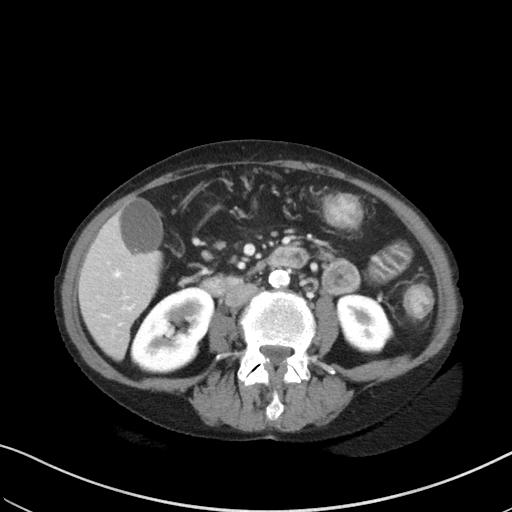
[im 15/44  lung]
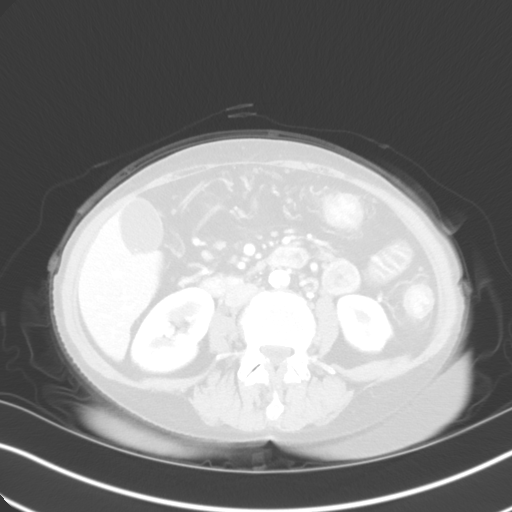
[im 15/44  bone]
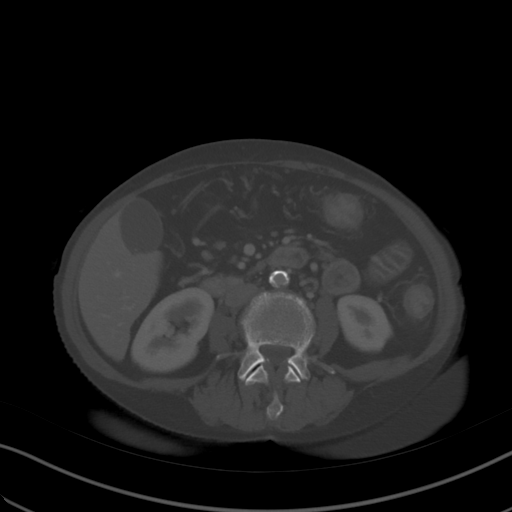
[im 29/44  soft-tissue]
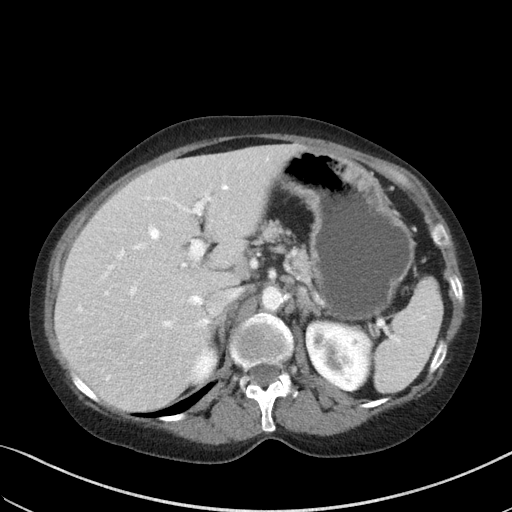
[im 29/44  lung]
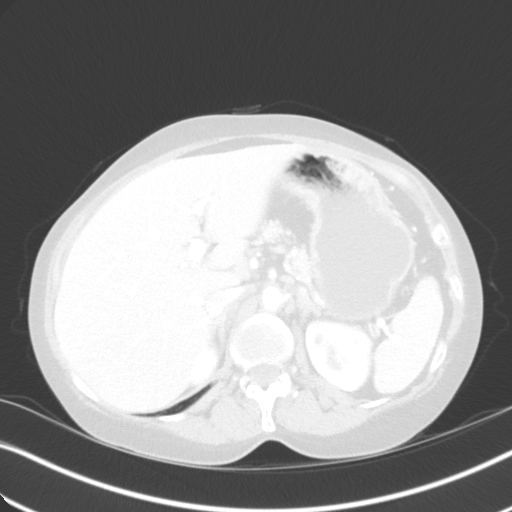

[Series 15: thin axial pancreas · axial · 0.69mm/px · z∈[-706,-562]mm · 5 of 73 slices shown]
[im 13/73  soft-tissue]
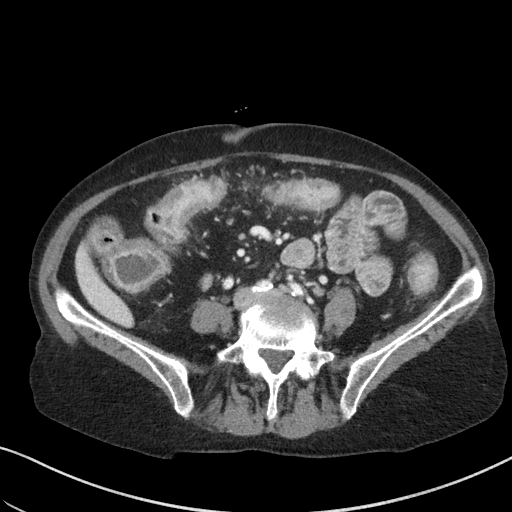
[im 25/73  soft-tissue]
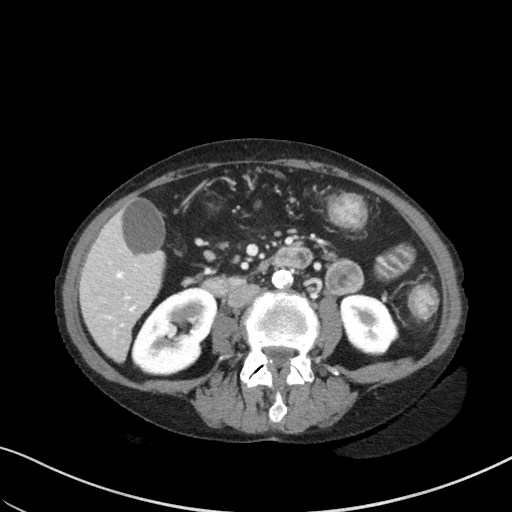
[im 37/73  soft-tissue]
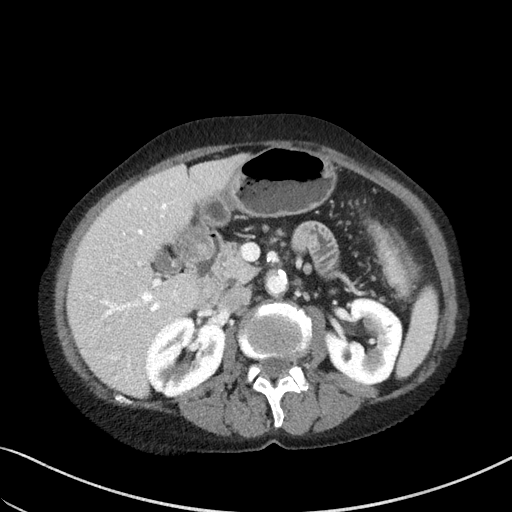
[im 49/73  soft-tissue]
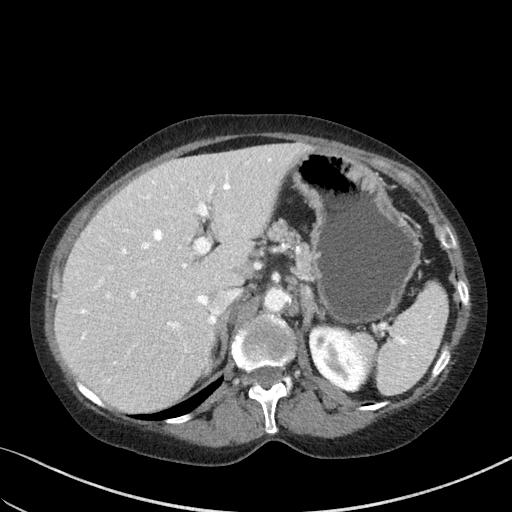
[im 61/73  soft-tissue]
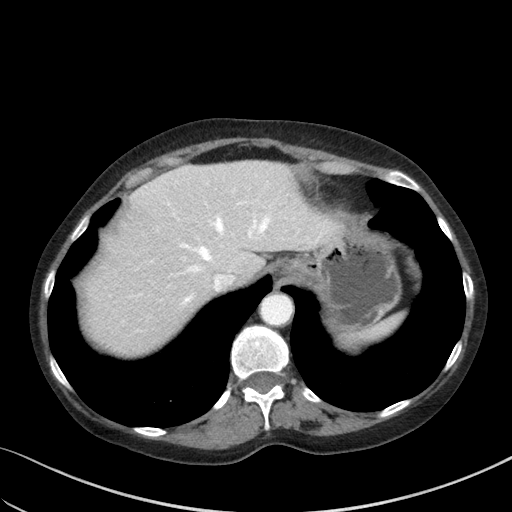

[14 of 32 positions shown; findings below may reference images not displayed]

FINDINGS: Lower chest: The lung bases are clear. No worrisome pulmonary
nodules or acute pulmonary infiltrates. No pleural effusion. The
heart is normal in size. Coronary artery calcifications are noted.

Hepatobiliary: No focal hepatic lesions or intrahepatic biliary
dilatation. The gallbladder demonstrates small layering calcified
gallstones. No findings for acute cholecystitis. No common bile duct
dilatation.

Pancreas: No mass, inflammation or ductal dilatation. Mild atrophy
of the pancreatic body and tail.

Spleen: Normal size.  Small calcified granulomas but no mass.

Adrenals/Urinary Tract: The adrenal glands and kidneys are
unremarkable. There are small bilateral scattered renal cysts. No
worrisome renal lesions, renal calculi or hydronephrosis.

Stomach/Bowel: The stomach, duodenum and visualized small bowel are
unremarkable. The colon demonstrates diffuse and fairly marked wall
thickening and submucosal edema suggesting active
inflammation/ulcerative colitis. No mass or obstruction.

Vascular/Lymphatic: Severe atherosclerotic calcifications involving
the aorta and branch vessel ostia. There are also renal artery
calcifications, right greater than left. Extensive iliac artery
calcifications. No focal aneurysm or dissection. The major venous
structures are patent.

Numerous borderline mesenteric and retroperitoneal lymph nodes
likely related to the ulcerative colitis and active inflammation.

Other: No ascites or abdominal wall hernia.

Musculoskeletal: No significant bony findings. Benign-appearing L5
hemangioma or intraosseous lipoma.
IMPRESSION: 1. Fairly extensive colonic inflammatory process consistent with
active ulcerative colitis.
2. Mild to moderate atrophy of the pancreas but no mass or
inflammation or ductal dilatation.
3. Cholelithiasis without findings for acute cholecystitis.
4. Inflammatory/hyperplastic the mesenteric and retroperitoneal
lymphadenopathy.
5. Severe/extensive atherosclerotic calcifications involving the
aorta and branch vessels.

## 2019-05-18 ENCOUNTER — Telehealth: Payer: Self-pay

## 2019-05-18 NOTE — Telephone Encounter (Signed)
Thanks Sharyn Lull for checking on her  RV

## 2019-05-18 NOTE — Telephone Encounter (Signed)
Called to check in on patient.  She states she's doing well.  She weaned herself off Apriso.  Has not had any problems with her Crohns, due to monitoring of her foods.  No diarrhea, or stomach issues at this time.  I've asked her to call the office if anything changes.  Thanks,  [place], [place]

## 2020-10-14 DIAGNOSIS — I779 Disorder of arteries and arterioles, unspecified: Secondary | ICD-10-CM | POA: Insufficient documentation

## 2020-10-30 ENCOUNTER — Ambulatory Visit (INDEPENDENT_AMBULATORY_CARE_PROVIDER_SITE_OTHER): Payer: Medicare Other | Admitting: Vascular Surgery

## 2020-10-30 ENCOUNTER — Other Ambulatory Visit: Payer: Self-pay

## 2020-10-30 VITALS — BP 181/62 | HR 62 | Ht 64.0 in | Wt 119.0 lb

## 2020-10-30 DIAGNOSIS — I6523 Occlusion and stenosis of bilateral carotid arteries: Secondary | ICD-10-CM

## 2020-10-30 DIAGNOSIS — I10 Essential (primary) hypertension: Secondary | ICD-10-CM

## 2020-10-30 DIAGNOSIS — E7800 Pure hypercholesterolemia, unspecified: Secondary | ICD-10-CM

## 2020-10-30 DIAGNOSIS — K50819 Crohn's disease of both small and large intestine with unspecified complications: Secondary | ICD-10-CM | POA: Diagnosis not present

## 2020-10-30 NOTE — Assessment & Plan Note (Signed)
blood pressure control important in reducing the progression of atherosclerotic disease. On appropriate oral medications.  

## 2020-10-30 NOTE — Progress Notes (Signed)
Patient ID: Denise Moore, female   DOB: 05-21-1945, 75 y.o.   MRN: [id number]  Chief Complaint  Patient presents with   New Patient (Initial Visit)    NP consult Carotid US in referral pack    HPI Denise Moore is a 75 y.o. female.  I am asked to see the patient by Dr. Ouida Sills for evaluation of carotid stenosis.  The patient reports no symptoms of cerebrovascular ischemia.Specifically, the patient denies amaurosis fugax, speech or swallowing difficulties, or arm or leg weakness or numbness.  Her primary care physician astutely heard a bruit on his physical exam recently which prompted an ultrasound which I have reviewed.  This demonstrates mild right carotid artery stenosis by their interpretation in the 1 to 49% range.  Her velocities following what we would interpret is the 50 to 69% range with velocities on the lower end of that range.   Past Medical History:  Diagnosis Date   Chronic ulcerative colitis ([place]) 01/22/2015   Last Assessment & Plan:  Post decadron treatment last month. Off now and feeling great.     GERD (gastroesophageal reflux disease)    Pancreatic insufficiency 03/07/2015   Last Assessment & Plan:  On her pancrease supplement.     Pure hypercholesterolemia 10/23/2014   Last Assessment & Plan:  On diet for now    Wears upper dentures     Past Surgical History:  Procedure Laterality Date   COLONOSCOPY  11/08/2014   COLONOSCOPY N/A 10/22/2016   Procedure: COLONOSCOPY;  Surgeon: Lin Landsman, MD;  Location: [facility];  Service: Gastroenterology;  Laterality: N/A;   ESOPHAGOGASTRODUODENOSCOPY N/A 10/22/2016   Procedure: ESOPHAGOGASTRODUODENOSCOPY (EGD);  Surgeon: Lin Landsman, MD;  Location: [facility];  Service: Gastroenterology;  Laterality: N/A;   fistua repair  04/07/1956   PILONIDAL CYST EXCISION  10/21/1965    Family History  Problem Relation Age of Onset   Cancer Mother    Diabetes Father    Heart disease Father        Social History   Tobacco Use   Smoking status: Every Day    Packs/day: 0.50    Years: 50.00    Pack years: 25.00    Types: Cigarettes   Smokeless tobacco: Never  Substance Use Topics   Alcohol use: No   Drug use: No     Allergies  Allergen Reactions   Other Itching    Bandaids  Bandaids    Penicillins Rash    swelling    Current Outpatient Medications  Medication Sig Dispense Refill   acetaminophen (TYLENOL) 650 MG CR tablet Take by mouth.     aspirin EC 81 MG tablet Take 81 mg by mouth daily. Swallow whole.     Cetirizine HCl 10 MG CAPS Take by mouth.     Cholecalciferol 25 MCG (1000 UT) tablet Take by mouth.     rosuvastatin (CRESTOR) 20 MG tablet Take by mouth.     Turmeric 1053 MG TABS Take by mouth.     pantoprazole (PROTONIX) 40 MG tablet Take 1 tablet (40 mg total) by mouth daily. 30 tablet 11   No current facility-administered medications for this visit.      REVIEW OF SYSTEMS (Negative unless checked)  Constitutional: [] Weight loss  [] Fever  [] Chills Cardiac: [] Chest pain   [] Chest pressure   [] Palpitations   [] Shortness of breath when laying flat   [] Shortness of breath at rest   [] Shortness of breath with exertion. Vascular:  [] Pain  in legs with walking   [] Pain in legs at rest   [] Pain in legs when laying flat   [] Claudication   [] Pain in feet when walking  [] Pain in feet at rest  [] Pain in feet when laying flat   [] History of DVT   [] Phlebitis   [] Swelling in legs   [] Varicose veins   [] Non-healing ulcers Pulmonary:   [] Uses home oxygen   [] Productive cough   [] Hemoptysis   [] Wheeze  [] COPD   [] Asthma Neurologic:  [] Dizziness  [] Blackouts   [] Seizures   [] History of stroke   [] History of TIA  [] Aphasia   [] Temporary blindness   [] Dysphagia   [] Weakness or numbness in arms   [] Weakness or numbness in legs Musculoskeletal:  [] Arthritis   [] Joint swelling   [] Joint pain   [] Low back pain Hematologic:  [] Easy bruising  [] Easy bleeding    [] Hypercoagulable state   [] Anemic  [] Hepatitis Gastrointestinal:  [] Blood in stool   [] Vomiting blood  [x] Gastroesophageal reflux/heartburn   [x] Abdominal pain Genitourinary:  [] Chronic kidney disease   [] Difficult urination  [] Frequent urination  [] Burning with urination   [] Hematuria Skin:  [] Rashes   [] Ulcers   [] Wounds Psychological:  [] History of anxiety   []  History of major depression.    Physical Exam BP (!) 181/62   Pulse 62   Ht 5' 4"  (1.626 m)   Wt 119 lb (54 kg)   BMI 20.43 kg/m  Gen:  WD/WN, NAD.  Appears younger than stated age Head: [place]/AT, No temporalis wasting.  Ear/Nose/Throat: Hearing grossly intact, nares w/o erythema or drainage, oropharynx w/o Erythema/Exudate Eyes: Conjunctiva clear, sclera non-icteric  Neck: trachea midline.  Soft left carotid bruit Pulmonary:  Good air movement, clear to auscultation bilaterally.  Cardiac: RRR, normal S1, S2 Vascular:  Vessel Right Left  Radial Palpable Palpable           Musculoskeletal: M/S 5/5 throughout.  Extremities without ischemic changes.  No deformity or atrophy.  No significant lower extremity edema. Neurologic: Sensation grossly intact in extremities.  Symmetrical.  Speech is fluent. Motor exam as listed above. Psychiatric: Judgment intact, Mood & affect appropriate for pt's clinical situation. Dermatologic: No rashes or ulcers noted.  No cellulitis or open wounds.    Radiology No results found.  Labs No results found for this or any previous visit (from the past 2160 hour(s)).  Assessment/Plan:  HTN (hypertension), benign blood pressure control important in reducing the progression of atherosclerotic disease. On appropriate oral medications.   Pure hypercholesterolemia lipid control important in reducing the progression of atherosclerotic disease. Continue statin therapy   Carotid artery disease ([place]) The patient is recently undergone a carotid duplex. This demonstrates mild right carotid artery  stenosis by their interpretation in the 1 to 49% range.  Her velocities following what we would interpret is the 50 to 69% range with velocities on the lower end of that range. The patient is on appropriate medical therapy with baby aspirin and a statin agent currently.  With her GI issues, if she cannot tolerate aspirin we would use Plavix and see if she could tolerate that.  This is below the threshold for surgery, we will follow this and I will plan to see her back in 6 months with a duplex in our office.  Contact our office with any neurologic symptoms in the interim.  Crohn's disease of both small and large intestine ([place]) So far has tolerated aspirin.  If this becomes a problem and aspirin is not tolerable, we  will try Plavix or another agent.      Leotis Pain 10/30/2020, 12:19 PM   This note was created with Dragon medical transcription system.  Any errors from dictation are unintentional.

## 2020-10-30 NOTE — Patient Instructions (Signed)
Carotid Artery Disease Carotid artery disease is the narrowing or blockage of one or both carotid arteries. This condition is also called carotid artery stenosis. The carotid arteries are the two main blood vessels on either side of the neck. They send blood to the brain, other parts of the head, and the neck.  This condition increases your risk for a stroke or a transient ischemic attack (TIA). A TIA is a "mini-stroke" that causes stroke-like symptoms that go away quickly. What are the causes? This condition is mainly caused by a narrowing and hardening of the carotid arteries. The carotid arteries can become narrow or clogged with a buildup of plaque. Plaque includes: Fat. Cholesterol. Calcium. Other substances. What increases the risk? The following factors may make you more likely to develop this condition: Having certain medical conditions, such as: High cholesterol. High blood pressure. Diabetes. Obesity. Smoking. A family history of cardiovascular disease. Not being active or lack of regular exercise. Being female. Men have a higher risk of having arteries become narrow and harden earlier in life than women. Old age. What are the signs or symptoms? This condition may not have any signs or symptoms until a stroke or TIA happens. In some cases, your doctor may be able to hear a whooshing sound. This can suggest a change in blood flow caused by plaque buildup. An eye exam can also help find signs of the condition. How is this treated? This condition may be treated with more than one treatment. Treatment options include: Lifestyle changes, such as: Quitting smoking. Getting regular exercise, or getting exercise as told by your doctor. Eating a healthy diet. Managing stress. Keeping a healthy weight. Medicines to control: Blood pressure. Cholesterol. Blood clotting. Surgery. You may have: A surgery to remove the blockages in the carotid arteries. A procedure in which a small mesh  tube (stent) is used to widen the blocked carotid arteries. Follow these instructions at home: Eating and drinking Follow instructions about your diet from your doctor. It is important to follow a healthy diet. Eat a diet that includes: A lot of fresh fruits and vegetables. Low-fat (lean) meats. Avoid these foods: Foods that are high in fat. Foods that are high in salt (sodium). Foods that are fried. Foods that are processed. Foods that have few good nutrients (poor nutritional value).  Lifestyle  Keep a healthy weight. Do exercises as told by your doctor to stay active. Each week, you should get one of the following: At least 150 minutes of exercise that raises your heart rate and makes you sweat (moderate-intensity exercise). At least 75 minutes of exercise that takes a lot of effort. Do not use any products that contain nicotine or tobacco, such as cigarettes, e-cigarettes, and chewing tobacco. If you need help quitting, ask your doctor. Do not drink alcohol if: Your doctor tells you not to drink. You are pregnant, may be pregnant, or are planning to become pregnant. If you drink alcohol: Limit how much you use to: 0-1 drink a day for women. 0-2 drinks a day for men. Be aware of how much alcohol is in your drink. In the [place] one drink equals one 12 oz bottle of beer (355 mL), one 5 oz glass of wine (148 mL), or one 1 oz glass of hard liquor (44 mL). Do not use drugs. Manage your stress. Ask your doctor for tips on how to do this. General instructions Take over-the-counter and prescription medicines only as told by your doctor. Keep all follow-up visits  as told by your doctor. This is important. Where to find more information American Heart Association: www.heart.org Get help right away if: You have any signs of a stroke. "BE FAST" is an easy way to remember the main warning signs: B - Balance. Signs are dizziness, sudden trouble walking, or loss of balance. E - Eyes. Signs  are trouble seeing or a change in how you see. F - Face. Signs are sudden weakness or loss of feeling of the face, or the face or eyelid drooping on one side. A - Arms. Signs are weakness or loss of feeling in an arm. This happens suddenly and usually on one side of the body. S - Speech. Signs are sudden trouble speaking, slurred speech, or trouble understanding what people say. T - Time. Time to call emergency services. Write down what time symptoms started. You have other signs of a stroke, such as: A sudden, very bad headache with no known cause. Feeling like you may vomit (nausea). Vomiting. A seizure. These symptoms may be an emergency. Do not wait to see if the symptoms will go away. Get medical help right away. Call your local emergency services (911 in the [place] Do not drive yourself to the hospital. Summary The carotid arteries are blood vessels on both sides of the neck. If these arteries get smaller or get blocked, you are more likely to have a stroke or a mini-stroke. This condition can be treated with lifestyle changes, medicines, surgery, or a blend of these treatments. Get help right away if you have any signs of a stroke. "BE FAST" is an easy way to remember the main warning signs of stroke. This information is not intended to replace advice given to you by your health care provider. Make sure you discuss any questions you have with your health care provider. Document Revised: 08/16/2018 Document Reviewed: 08/16/2018 Elsevier Patient Education  [place].

## 2020-10-30 NOTE — Assessment & Plan Note (Signed)
So far has tolerated aspirin.  If this becomes a problem and aspirin is not tolerable, we will try Plavix or another agent.

## 2020-10-30 NOTE — Assessment & Plan Note (Signed)
lipid control important in reducing the progression of atherosclerotic disease. Continue statin therapy  

## 2020-10-30 NOTE — Assessment & Plan Note (Signed)
The patient is recently undergone a carotid duplex. This demonstrates mild right carotid artery stenosis by their interpretation in the 1 to 49% range.  Her velocities following what we would interpret is the 50 to 69% range with velocities on the lower end of that range. The patient is on appropriate medical therapy with baby aspirin and a statin agent currently.  With her GI issues, if she cannot tolerate aspirin we would use Plavix and see if she could tolerate that.  This is below the threshold for surgery, we will follow this and I will plan to see her back in 6 months with a duplex in our office.  Contact our office with any neurologic symptoms in the interim.

## 2021-03-22 ENCOUNTER — Other Ambulatory Visit: Payer: Self-pay | Admitting: Physician Assistant

## 2021-03-22 DIAGNOSIS — M542 Cervicalgia: Secondary | ICD-10-CM

## 2021-03-22 DIAGNOSIS — R519 Headache, unspecified: Secondary | ICD-10-CM

## 2021-03-22 DIAGNOSIS — R200 Anesthesia of skin: Secondary | ICD-10-CM

## 2021-03-22 DIAGNOSIS — R202 Paresthesia of skin: Secondary | ICD-10-CM

## 2021-04-05 ENCOUNTER — Ambulatory Visit
Admission: RE | Admit: 2021-04-05 | Discharge: 2021-04-05 | Disposition: A | Discharge status: Home / Self Care | Payer: Medicare Other | Source: Ambulatory Visit | Attending: Physician Assistant | Admitting: Physician Assistant

## 2021-04-05 ENCOUNTER — Other Ambulatory Visit: Payer: Self-pay

## 2021-04-05 DIAGNOSIS — R200 Anesthesia of skin: Secondary | ICD-10-CM

## 2021-04-05 DIAGNOSIS — M542 Cervicalgia: Secondary | ICD-10-CM | POA: Insufficient documentation

## 2021-04-05 DIAGNOSIS — R519 Headache, unspecified: Secondary | ICD-10-CM

## 2021-04-05 DIAGNOSIS — R202 Paresthesia of skin: Secondary | ICD-10-CM | POA: Insufficient documentation

## 2021-04-05 MED ORDER — GADOBUTROL 1 MMOL/ML IV SOLN
5.0000 mL | Freq: Once | INTRAVENOUS | Status: AC | PRN
  Administered 2021-04-05: 5 mL via INTRAVENOUS

## 2021-04-30 ENCOUNTER — Encounter (INDEPENDENT_AMBULATORY_CARE_PROVIDER_SITE_OTHER): Payer: Self-pay | Admitting: Nurse Practitioner

## 2021-04-30 ENCOUNTER — Other Ambulatory Visit: Payer: Self-pay

## 2021-04-30 ENCOUNTER — Ambulatory Visit (INDEPENDENT_AMBULATORY_CARE_PROVIDER_SITE_OTHER): Payer: Medicare Other | Admitting: Nurse Practitioner

## 2021-04-30 ENCOUNTER — Ambulatory Visit (INDEPENDENT_AMBULATORY_CARE_PROVIDER_SITE_OTHER): Payer: Medicare Other

## 2021-04-30 ENCOUNTER — Ambulatory Visit (INDEPENDENT_AMBULATORY_CARE_PROVIDER_SITE_OTHER): Payer: Medicare Other | Admitting: Vascular Surgery

## 2021-04-30 VITALS — BP 145/70 | HR 65 | Resp 17 | Ht 64.0 in | Wt 119.2 lb

## 2021-04-30 DIAGNOSIS — I6523 Occlusion and stenosis of bilateral carotid arteries: Secondary | ICD-10-CM | POA: Diagnosis not present

## 2021-04-30 DIAGNOSIS — I739 Peripheral vascular disease, unspecified: Secondary | ICD-10-CM

## 2021-04-30 DIAGNOSIS — I10 Essential (primary) hypertension: Secondary | ICD-10-CM | POA: Diagnosis not present

## 2021-04-30 DIAGNOSIS — E7800 Pure hypercholesterolemia, unspecified: Secondary | ICD-10-CM | POA: Diagnosis not present

## 2021-05-12 ENCOUNTER — Encounter (INDEPENDENT_AMBULATORY_CARE_PROVIDER_SITE_OTHER): Payer: Self-pay | Admitting: Nurse Practitioner

## 2021-05-12 NOTE — Progress Notes (Signed)
? ?Subjective:  ? ? Patient ID: Denise Moore, female    DOB: [birth year], 76 y.o.   MRN: [id number] ?Chief Complaint  ?Patient presents with  ? Follow-up  ?   ?Ultrasound  ? ? ?Denise Moore is a 76 y.o. female.  The patient returns today for evaluation of her carotid artery stenosis.  She continues to deny any symptoms of amaurosis fugax, speech or swallowing difficulty, arm or leg weakness or numbness.  Previously the patient had a duplex which noted mild right carotid artery stenosis in the 1 to 49% range however her velocities what we would interpret in the 50 to 69% range with velocities on the lower end of that range.  She does note some claudication-like symptoms.  The patient is a current smoker.  She denies any rest pain or any open wounds or ulcerations. ? ?Today noninvasive studies show velocities in the right ICA consistent with a 40 to 59% stenosis in the left has 1 to 39% stenosis.  Bilateral vertebral arteries demonstrate antegrade flow with normal flow hemodynamics in the bilateral subclavian arteries. ? ? ?Review of Systems  ?All other systems reviewed and are negative. ? ?   ?Objective:  ? Physical Exam ?Vitals reviewed.  ?HENT:  ?   Head: Normocephalic.  ?Neck:  ?   Vascular: Carotid bruit present.  ?Cardiovascular:  ?   Rate and Rhythm: Normal rate.  ?   Pulses: Decreased pulses.  ?Pulmonary:  ?   Effort: Pulmonary effort is normal.  ?Skin: ?   General: Skin is warm and dry.  ?Neurological:  ?   Mental Status: She is alert and oriented to person, place, and time.  ?Psychiatric:     ?   Mood and Affect: Mood normal.     ?   Behavior: Behavior normal.     ?   Thought Content: Thought content normal.     ?   Judgment: Judgment normal.  ? ? ?BP (!) 145/70 (BP Location: Left Arm)   Pulse 65   Resp 17   Ht 5' 4"  (1.626 m)   Wt 119 lb 3.2 oz (54.1 kg)   BMI 20.46 kg/m?  ? ?Past Medical History:  ?Diagnosis Date  ? Chronic ulcerative colitis ([place]) 01/22/2015  ? Last Assessment & Plan:  Post decadron  treatment last month. Off now and feeling great.    ? GERD (gastroesophageal reflux disease)   ? Pancreatic insufficiency 03/07/2015  ? Last Assessment & Plan:  On her pancrease supplement.    ? Pure hypercholesterolemia 10/23/2014  ? Last Assessment & Plan:  On diet for now   ? Wears upper dentures   ? ? ?Social History  ? ?Socioeconomic History  ? Marital status: Widowed  ?  Spouse name: Not on file  ? Number of children: Not on file  ? Years of education: Not on file  ? Highest education level: Not on file  ?Occupational History  ? Not on file  ?Tobacco Use  ? Smoking status: Every Day  ?  Packs/day: 0.50  ?  Years: 50.00  ?  Pack years: 25.00  ?  Types: Cigarettes  ? Smokeless tobacco: Never  ?Substance and Sexual Activity  ? Alcohol use: No  ? Drug use: No  ? Sexual activity: Not on file  ?Other Topics Concern  ? Not on file  ?Social History Narrative  ? Not on file  ? ?Social Determinants of Health  ? ?Financial Resource Strain: Not on file  ?Food Insecurity:  Not on file  ?Transportation Needs: Not on file  ?Physical Activity: Not on file  ?Stress: Not on file  ?Social Connections: Not on file  ?Intimate Partner Violence: Not on file  ? ? ?Past Surgical History:  ?Procedure Laterality Date  ? COLONOSCOPY  11/08/2014  ? COLONOSCOPY N/A 10/22/2016  ? Procedure: COLONOSCOPY;  Surgeon: Lin Landsman, MD;  Location: [facility];  Service: Gastroenterology;  Laterality: N/A;  ? ESOPHAGOGASTRODUODENOSCOPY N/A 10/22/2016  ? Procedure: ESOPHAGOGASTRODUODENOSCOPY (EGD);  Surgeon: Lin Landsman, MD;  Location: [facility];  Service: Gastroenterology;  Laterality: N/A;  ? fistua repair  04/07/1956  ? PILONIDAL CYST EXCISION  10/21/1965  ? ? ?Family History  ?Problem Relation Age of Onset  ? Cancer Mother   ? Diabetes Father   ? Heart disease Father   ? ? ?Allergies  ?Allergen Reactions  ? Other Itching  ?  Bandaids  ?Bandaids   ? Penicillins Rash  ?  swelling  ? ? ? ?  Latest Ref Rng & Units 04/12/2018   ?  1:36 PM 10/02/2016  ?  3:24 PM  ?CBC  ?WBC 3.4 - 10.8 x10E3/uL 12.0   16.2    ?Hemoglobin 11.1 - 15.9 g/dL 14.6   14.6    ?Hematocrit 34.0 - 46.6 % 42.3   42.9    ?Platelets 150 - 450 x10E3/uL 194   213    ? ? ? ? ?CMP  ?   ?Component Value Date/Time  ? NA 140 04/12/2018 1335  ? K 4.7 04/12/2018 1335  ? CL 100 04/12/2018 1335  ? CO2 22 04/12/2018 1335  ? GLUCOSE 91 04/12/2018 1335  ? GLUCOSE 94 10/02/2016 1524  ? BUN 13 04/12/2018 1335  ? CREATININE 0.69 04/12/2018 1335  ? CALCIUM 9.8 04/12/2018 1335  ? PROT 7.3 04/12/2018 1335  ? ALBUMIN 4.9 (H) 04/12/2018 1335  ? AST 17 04/12/2018 1335  ? ALT 14 04/12/2018 1335  ? ALKPHOS 83 04/12/2018 1335  ? BILITOT 0.4 04/12/2018 1335  ? GFRNONAA 87 04/12/2018 1335  ? GFRAA 101 04/12/2018 1335  ? ? ? ?No results found. ? ?   ?Assessment & Plan:  ? ?1. Bilateral carotid artery stenosis ?Recommend: ? ?Given the patient's asymptomatic subcritical stenosis no further invasive testing or surgery at this time. ? ?Duplex ultrasound shows 40 to 59% in the right ICA with 1 to 39% on the left stenosis bilaterally.  Previous ultrasound estimated velocities in the 50 to 69% range therefore we will have her return in 6 months. ? ?Continue antiplatelet therapy as prescribed ?Continue management of CAD, HTN and Hyperlipidemia ?Healthy heart diet,  encouraged exercise at least 4 times per week ?Follow up in 6 months with duplex ultrasound and physical exam   ? ?2. HTN (hypertension), benign ?Continue antihypertensive medications as already ordered, these medications have been reviewed and there are no changes at this time.  ? ?3. Pure hypercholesterolemia ?Continue statin as ordered and reviewed, no changes at this time  ? ?4. Claudication ([place]) ?The patient does have some symptoms concerning for claudication.  I discussed with patient the option for returning sooner with ABIs versus waiting for carotid follow-up in 6 months.  At this time patient wishes to wait until her follow-up however  if her symptoms worsen she can contact us and we will try to move her appointment up. ? ? ?Current Outpatient Medications on File Prior to Visit  ?Medication Sig Dispense Refill  ? acetaminophen (TYLENOL) 650 MG CR tablet  Take by mouth.    ? aspirin EC 81 MG tablet Take 81 mg by mouth daily. Swallow whole.    ? Cetirizine HCl 10 MG CAPS Take by mouth.    ? Cholecalciferol 25 MCG (1000 UT) tablet Take by mouth.    ? gabapentin (NEURONTIN) 100 MG capsule Take 200 mg by mouth 2 (two) times daily.    ? ibuprofen (ADVIL) 200 MG tablet Take 200 mg by mouth every 6 (six) hours as needed.    ? losartan (COZAAR) 50 MG tablet Take 50 mg by mouth daily.    ? rosuvastatin (CRESTOR) 20 MG tablet Take by mouth.    ? Turmeric 1053 MG TABS Take by mouth. (Patient not taking: Reported on 04/30/2021)    ? ?No current facility-administered medications on file prior to visit.  ? ? ?There are no Patient Instructions on file for this visit. ?No follow-ups on file. ? ? ?Kris Hartmann, NP ? ? ?

## 2021-05-20 ENCOUNTER — Telehealth: Payer: Self-pay | Admitting: Gastroenterology

## 2021-05-20 NOTE — Telephone Encounter (Signed)
Pt calls and advises she thinks she is having crohns flare ?She has weakness, diarrhea, nausea and vomitting, wants to know if she can be seen today or tomorrow ?If she cannot be seen she requests medication call in ?Last seen in office 04/2018 ?

## 2021-05-20 NOTE — Telephone Encounter (Signed)
Called patient and patient has not been seen since 04/09/2018 and she is a new patient. Informed patient we would need a new referral sent to our office and we can not give advice since she a new patient. Patient said okay and disconnected the phone  ?

## 2021-09-18 DIAGNOSIS — E118 Type 2 diabetes mellitus with unspecified complications: Secondary | ICD-10-CM | POA: Diagnosis present

## 2021-09-18 DIAGNOSIS — E119 Type 2 diabetes mellitus without complications: Secondary | ICD-10-CM | POA: Diagnosis present

## 2021-10-08 ENCOUNTER — Ambulatory Visit: Payer: Medicare Other | Admitting: Gastroenterology

## 2021-10-30 ENCOUNTER — Other Ambulatory Visit (INDEPENDENT_AMBULATORY_CARE_PROVIDER_SITE_OTHER): Payer: Self-pay | Admitting: Nurse Practitioner

## 2021-10-30 DIAGNOSIS — I6523 Occlusion and stenosis of bilateral carotid arteries: Secondary | ICD-10-CM

## 2021-10-30 DIAGNOSIS — I739 Peripheral vascular disease, unspecified: Secondary | ICD-10-CM

## 2021-11-01 ENCOUNTER — Encounter (INDEPENDENT_AMBULATORY_CARE_PROVIDER_SITE_OTHER): Payer: Self-pay

## 2021-11-01 ENCOUNTER — Encounter (INDEPENDENT_AMBULATORY_CARE_PROVIDER_SITE_OTHER): Payer: Medicare Other

## 2021-11-01 ENCOUNTER — Ambulatory Visit (INDEPENDENT_AMBULATORY_CARE_PROVIDER_SITE_OTHER): Payer: Medicare Other

## 2021-11-01 ENCOUNTER — Ambulatory Visit (INDEPENDENT_AMBULATORY_CARE_PROVIDER_SITE_OTHER): Payer: Medicare Other | Admitting: Vascular Surgery

## 2021-11-01 DIAGNOSIS — I6523 Occlusion and stenosis of bilateral carotid arteries: Secondary | ICD-10-CM

## 2021-12-02 ENCOUNTER — Encounter (INDEPENDENT_AMBULATORY_CARE_PROVIDER_SITE_OTHER): Payer: Self-pay

## 2022-02-09 ENCOUNTER — Inpatient Hospital Stay
Admission: EM | Admit: 2022-02-09 | Discharge: 2022-02-11 | DRG: 194 | Disposition: A | Discharge status: Home / Self Care | Payer: Medicare Other | Attending: Internal Medicine | Admitting: Internal Medicine

## 2022-02-09 ENCOUNTER — Emergency Department: Payer: Medicare Other

## 2022-02-09 ENCOUNTER — Other Ambulatory Visit: Payer: Self-pay

## 2022-02-09 DIAGNOSIS — Z79899 Other long term (current) drug therapy: Secondary | ICD-10-CM

## 2022-02-09 DIAGNOSIS — F17210 Nicotine dependence, cigarettes, uncomplicated: Secondary | ICD-10-CM | POA: Diagnosis present

## 2022-02-09 DIAGNOSIS — E118 Type 2 diabetes mellitus with unspecified complications: Secondary | ICD-10-CM | POA: Diagnosis present

## 2022-02-09 DIAGNOSIS — K5090 Crohn's disease, unspecified, without complications: Secondary | ICD-10-CM | POA: Diagnosis present

## 2022-02-09 DIAGNOSIS — Z8249 Family history of ischemic heart disease and other diseases of the circulatory system: Secondary | ICD-10-CM

## 2022-02-09 DIAGNOSIS — R531 Weakness: Secondary | ICD-10-CM | POA: Diagnosis not present

## 2022-02-09 DIAGNOSIS — J159 Unspecified bacterial pneumonia: Principal | ICD-10-CM | POA: Diagnosis present

## 2022-02-09 DIAGNOSIS — I959 Hypotension, unspecified: Secondary | ICD-10-CM

## 2022-02-09 DIAGNOSIS — Z833 Family history of diabetes mellitus: Secondary | ICD-10-CM

## 2022-02-09 DIAGNOSIS — Z66 Do not resuscitate: Secondary | ICD-10-CM | POA: Diagnosis not present

## 2022-02-09 DIAGNOSIS — E876 Hypokalemia: Secondary | ICD-10-CM | POA: Diagnosis not present

## 2022-02-09 DIAGNOSIS — I10 Essential (primary) hypertension: Secondary | ICD-10-CM | POA: Diagnosis present

## 2022-02-09 DIAGNOSIS — E7800 Pure hypercholesterolemia, unspecified: Secondary | ICD-10-CM | POA: Diagnosis present

## 2022-02-09 DIAGNOSIS — I9589 Other hypotension: Secondary | ICD-10-CM | POA: Diagnosis present

## 2022-02-09 DIAGNOSIS — E119 Type 2 diabetes mellitus without complications: Secondary | ICD-10-CM | POA: Diagnosis present

## 2022-02-09 DIAGNOSIS — J9601 Acute respiratory failure with hypoxia: Secondary | ICD-10-CM

## 2022-02-09 DIAGNOSIS — E860 Dehydration: Secondary | ICD-10-CM | POA: Diagnosis present

## 2022-02-09 DIAGNOSIS — Z880 Allergy status to penicillin: Secondary | ICD-10-CM

## 2022-02-09 DIAGNOSIS — Z8616 Personal history of COVID-19: Secondary | ICD-10-CM

## 2022-02-09 DIAGNOSIS — Z7982 Long term (current) use of aspirin: Secondary | ICD-10-CM

## 2022-02-09 DIAGNOSIS — I6523 Occlusion and stenosis of bilateral carotid arteries: Secondary | ICD-10-CM | POA: Diagnosis present

## 2022-02-09 DIAGNOSIS — Z91048 Other nonmedicinal substance allergy status: Secondary | ICD-10-CM

## 2022-02-09 DIAGNOSIS — J189 Pneumonia, unspecified organism: Secondary | ICD-10-CM | POA: Diagnosis not present

## 2022-02-09 DIAGNOSIS — E1165 Type 2 diabetes mellitus with hyperglycemia: Secondary | ICD-10-CM | POA: Diagnosis present

## 2022-02-09 DIAGNOSIS — K219 Gastro-esophageal reflux disease without esophagitis: Secondary | ICD-10-CM | POA: Diagnosis present

## 2022-02-09 DIAGNOSIS — E871 Hypo-osmolality and hyponatremia: Secondary | ICD-10-CM | POA: Diagnosis present

## 2022-02-09 LAB — URINALYSIS, ROUTINE W REFLEX MICROSCOPIC
Bilirubin Urine: NEGATIVE
Glucose, UA: NEGATIVE mg/dL
Hgb urine dipstick: NEGATIVE
Ketones, ur: NEGATIVE mg/dL
Leukocytes,Ua: NEGATIVE
Nitrite: NEGATIVE
Protein, ur: NEGATIVE mg/dL
Specific Gravity, Urine: 1.004 — ABNORMAL LOW (ref 1.005–1.030)
pH: 7 (ref 5.0–8.0)

## 2022-02-09 LAB — COMPREHENSIVE METABOLIC PANEL
ALT: 29 U/L (ref 0–44)
AST: 35 U/L (ref 15–41)
Albumin: 3.5 g/dL (ref 3.5–5.0)
Alkaline Phosphatase: 111 U/L (ref 38–126)
Anion gap: 11 (ref 5–15)
BUN: 10 mg/dL (ref 8–23)
CO2: 24 mmol/L (ref 22–32)
Calcium: 8.5 mg/dL — ABNORMAL LOW (ref 8.9–10.3)
Chloride: 96 mmol/L — ABNORMAL LOW (ref 98–111)
Creatinine, Ser: 0.69 mg/dL (ref 0.44–1.00)
GFR, Estimated: >60 mL/min (ref >60)
Glucose, Bld: 129 mg/dL — ABNORMAL HIGH (ref 70–99)
Potassium: 2.5 mmol/L — CL (ref 3.5–5.1)
Sodium: 131 mmol/L — ABNORMAL LOW (ref 135–145)
Total Bilirubin: 1.1 mg/dL (ref 0.3–1.2)
Total Protein: 7.7 g/dL (ref 6.5–8.1)

## 2022-02-09 LAB — CBC
HCT: 36 % (ref 36.0–46.0)
Hemoglobin: 12.1 g/dL (ref 12.0–15.0)
MCH: 30.5 pg (ref 26.0–34.0)
MCHC: 33.6 g/dL (ref 30.0–36.0)
MCV: 90.7 fL (ref 80.0–100.0)
Platelets: 238 10*3/uL (ref 150–400)
RBC: 3.97 MIL/uL (ref 3.87–5.11)
RDW: 12.5 % (ref 11.5–15.5)
WBC: 18.9 10*3/uL — ABNORMAL HIGH (ref 4.0–10.5)
nRBC: 0 % (ref 0.0–0.2)

## 2022-02-09 LAB — EXPECTORATED SPUTUM ASSESSMENT W GRAM STAIN, RFLX TO RESP C

## 2022-02-09 LAB — MRSA NEXT GEN BY PCR, NASAL: MRSA by PCR Next Gen: NOT DETECTED

## 2022-02-09 LAB — PROCALCITONIN: Procalcitonin: 0.61 ng/mL

## 2022-02-09 LAB — LACTIC ACID, PLASMA: Lactic Acid, Venous: 1.4 mmol/L (ref 0.5–1.9)

## 2022-02-09 LAB — MAGNESIUM: Magnesium: 2 mg/dL (ref 1.7–2.4)

## 2022-02-09 MED ORDER — ONDANSETRON HCL 4 MG/2ML IJ SOLN: 4.0000 mg | Freq: Four times a day (QID) | INTRAMUSCULAR | Status: DC | PRN

## 2022-02-09 MED ORDER — HYDROCOD POLI-CHLORPHE POLI ER 10-8 MG/5ML PO SUER
2.5000 mL | Freq: Two times a day (BID) | ORAL | Status: DC | PRN
  Administered 2022-02-09 – 2022-02-10 (×2): 2.5 mL via ORAL
  Filled 2022-02-09 (×2): qty 5

## 2022-02-09 MED ORDER — LACTATED RINGERS IV SOLN: INTRAVENOUS | Status: AC

## 2022-02-09 MED ORDER — SODIUM CHLORIDE 0.9 % IV BOLUS (SEPSIS)
1000.0000 mL | Freq: Once | INTRAVENOUS | Status: AC
  Administered 2022-02-09: 1000 mL via INTRAVENOUS

## 2022-02-09 MED ORDER — ROSUVASTATIN CALCIUM 20 MG PO TABS
20.0000 mg | ORAL_TABLET | Freq: Every evening | ORAL | Status: DC
  Filled 2022-02-09 (×2): qty 1

## 2022-02-09 MED ORDER — LOSARTAN POTASSIUM 50 MG PO TABS
50.0000 mg | ORAL_TABLET | Freq: Every day | ORAL | Status: DC
  Administered 2022-02-10 – 2022-02-11 (×2): 50 mg via ORAL
  Filled 2022-02-09 (×2): qty 1

## 2022-02-09 MED ORDER — SODIUM CHLORIDE 0.9 % IV SOLN
500.0000 mg | INTRAVENOUS | Status: DC
  Administered 2022-02-09: 500 mg via INTRAVENOUS
  Filled 2022-02-09: qty 5

## 2022-02-09 MED ORDER — LACTATED RINGERS IV BOLUS
1000.0000 mL | Freq: Once | INTRAVENOUS | Status: AC
  Administered 2022-02-09: 1000 mL via INTRAVENOUS

## 2022-02-09 MED ORDER — SODIUM CHLORIDE 0.9 % IV SOLN
2.0000 g | INTRAVENOUS | Status: DC
  Administered 2022-02-09 – 2022-02-10 (×2): 2 g via INTRAVENOUS
  Filled 2022-02-09 (×3): qty 20

## 2022-02-09 MED ORDER — ASPIRIN 81 MG PO TBEC
81.0000 mg | DELAYED_RELEASE_TABLET | Freq: Every day | ORAL | Status: DC
  Administered 2022-02-10 – 2022-02-11 (×2): 81 mg via ORAL
  Filled 2022-02-09 (×2): qty 1

## 2022-02-09 MED ORDER — GUAIFENESIN ER 600 MG PO TB12
600.0000 mg | ORAL_TABLET | Freq: Two times a day (BID) | ORAL | Status: DC
  Administered 2022-02-09 – 2022-02-11 (×4): 600 mg via ORAL
  Filled 2022-02-09 (×4): qty 1

## 2022-02-09 MED ORDER — GABAPENTIN 100 MG PO CAPS
200.0000 mg | ORAL_CAPSULE | Freq: Two times a day (BID) | ORAL | Status: DC
  Administered 2022-02-09 – 2022-02-11 (×4): 200 mg via ORAL
  Filled 2022-02-09 (×4): qty 2

## 2022-02-09 MED ORDER — POTASSIUM CHLORIDE 20 MEQ PO PACK
40.0000 meq | PACK | ORAL | Status: AC
  Administered 2022-02-09: 40 meq via ORAL
  Filled 2022-02-09: qty 2

## 2022-02-09 MED ORDER — POTASSIUM CHLORIDE 10 MEQ/100ML IV SOLN
10.0000 meq | INTRAVENOUS | Status: AC
  Administered 2022-02-09 (×4): 10 meq via INTRAVENOUS
  Filled 2022-02-09 (×3): qty 100

## 2022-02-09 MED ORDER — ENOXAPARIN SODIUM 40 MG/0.4ML IJ SOSY
40.0000 mg | PREFILLED_SYRINGE | INTRAMUSCULAR | Status: DC
  Administered 2022-02-09 – 2022-02-10 (×2): 40 mg via SUBCUTANEOUS
  Filled 2022-02-09 (×2): qty 0.4

## 2022-02-09 NOTE — ED Notes (Signed)
Pt in xray

## 2022-02-09 NOTE — ED Notes (Signed)
MD aware of low systolic BP's.

## 2022-02-09 NOTE — ED Triage Notes (Signed)
Pt in via EMS from home with c/o SOB and weakness and decreased appetite.  Pt was dx'd 2 weeks ago with COVID. Pt with continued congested cough. Pt has needed assistance with standing and walking. Family reports pt appears confused as well. 99.8 temp, cbg 148, 133/66, HR 108, 93-94% RA.

## 2022-02-09 NOTE — Assessment & Plan Note (Signed)
Currently managed with diet only.  Last A1c 6.6% 4 months ago.  No indication at this time for SSI

## 2022-02-09 NOTE — ED Notes (Signed)
Pt up to the bathroom with steady gait. Coughing a lot clear/dark purple/bloody sputum. MD aware.

## 2022-02-09 NOTE — ED Notes (Signed)
See triage note. Pt came to bed 11H on 2L, obtaining repeat VS now. Was 89% in triage. Covid + 2wk ago, still weak and now needing assistance to walk per family. Pt is alert, oriented.

## 2022-02-09 NOTE — Assessment & Plan Note (Addendum)
-   Hold home Losartan in the setting of hypotension

## 2022-02-09 NOTE — H&P (Addendum)
History and Physical    Patient: Denise Moore HQI:[id number] DOB: 1945-11-01 DOA: 02/09/2022 DOS: the patient was seen and examined on 02/09/2022 PCP: Kirk Ruths, MD  Patient coming from: Home  Chief Complaint:  Chief Complaint  Patient presents with   Weakness   HPI: Denise Moore is a 77 y.o. female with medical history significant of Crohn's disease, hypertension, hyperlipidemia, bilateral carotid artery stenosis, who presents to the ED with generalized weakness in the setting of recent COVID-19 infection.  Denise Moore states that she tested positive for COVID-19 on 01/21/2022.  During that time, she experienced a cough, nausea, malaise but this generally improved and starting on 02/02/2022, she was feeling significantly better. However on 02/05/2022, she noticed that she was having nausea with vomiting again, generalized malaise and weakness in addition to a rapidly progressing nonproductive cough.  She denies any shortness of breath, chest pain or palpitations at this time.  She denies any diarrhea or abdominal pain.  Due to nausea, she has had very poor p.o. intake over the last 3 to 4 days.  ED course: On arrival to the ED, patient was afebrile at 99.5 with blood pressure 132/53 and heart rate of 99.  She was saturating at 89% on room air and was subsequently placed on 2 L of supplemental oxygen.  Initial workup remarkable for sodium 131, potassium of 2.5, glucose of 129, creatinine 0.69, WBC of 18.9.  Lactic acid within normal limits.  Chest x-ray was obtained that demonstrated moderate airspace opacities in the right middle lobe.  Patient was started on ceftriaxone and azithromycin.  TRH contacted for admission.  Review of Systems: As mentioned in the history of present illness. All other systems reviewed and are negative.  Past Medical History:  Diagnosis Date   Chronic ulcerative colitis ([place]) 01/22/2015   Last Assessment & Plan:  Post decadron treatment last month. Off now and  feeling great.     GERD (gastroesophageal reflux disease)    Pancreatic insufficiency 03/07/2015   Last Assessment & Plan:  On her pancrease supplement.     Pure hypercholesterolemia 10/23/2014   Last Assessment & Plan:  On diet for now    Wears upper dentures    Past Surgical History:  Procedure Laterality Date   COLONOSCOPY  11/08/2014   COLONOSCOPY N/A 10/22/2016   Procedure: COLONOSCOPY;  Surgeon: Lin Landsman, MD;  Location: [place];  Service: Gastroenterology;  Laterality: N/A;   ESOPHAGOGASTRODUODENOSCOPY N/A 10/22/2016   Procedure: ESOPHAGOGASTRODUODENOSCOPY (EGD);  Surgeon: Lin Landsman, MD;  Location: [place];  Service: Gastroenterology;  Laterality: N/A;   fistua repair  04/07/1956   PILONIDAL CYST EXCISION  10/21/1965   Social History:  reports that she has been smoking cigarettes. She has a 25.00 pack-year smoking history. She has never used smokeless tobacco. She reports that she does not drink alcohol and does not use drugs.  Allergies  Allergen Reactions   Other Itching    Bandaids  Bandaids    Penicillins Rash    swelling    Family History  Problem Relation Age of Onset   Cancer Mother    Diabetes Father    Heart disease Father     Prior to Admission medications   Medication Sig Start Date End Date Taking? Authorizing Provider  acetaminophen (TYLENOL) 650 MG CR tablet Take by mouth.    [provider]  aspirin EC 81 MG tablet Take 81 mg by mouth daily. Swallow whole.    [provider]  Cetirizine HCl 10 MG CAPS Take by mouth. 12/25/14   [provider]  Cholecalciferol 25 MCG (1000 UT) tablet Take by mouth.    [provider]  gabapentin (NEURONTIN) 100 MG capsule Take 200 mg by mouth 2 (two) times daily.    [provider]  ibuprofen (ADVIL) 200 MG tablet Take 200 mg by mouth every 6 (six) hours as needed.    [provider]  losartan (COZAAR) 50 MG tablet Take 50 mg by  mouth daily.    [provider]  rosuvastatin (CRESTOR) 20 MG tablet Take by mouth. 10/15/20 10/15/21  [provider]  Turmeric 1053 MG TABS Take by mouth. Patient not taking: Reported on 04/30/2021    [provider]    Physical Exam: Vitals:   02/09/22 1700 02/09/22 1745 02/09/22 1830 02/09/22 1845  BP: (!) 98/43  (!) 82/69 (!) 106/44  Pulse: 93  93 92  Resp: 19  (!) 27 (!) 24  Temp:  98.4 F (36.9 C)    TempSrc:  Oral    SpO2: 100%  97% 96%  Weight:      Height:       Physical Exam Vitals and nursing note reviewed.  Constitutional:      Appearance: She is normal weight. She is ill-appearing.  HENT:     Head: Normocephalic and atraumatic.     Mouth/Throat:     Mouth: Mucous membranes are dry.     Pharynx: Oropharynx is clear.  Eyes:     Conjunctiva/sclera: Conjunctivae normal.     Pupils: Pupils are equal, round, and reactive to light.  Cardiovascular:     Rate and Rhythm: Normal rate and regular rhythm.  Pulmonary:     Effort: Tachypnea present. No accessory muscle usage or respiratory distress.     Breath sounds: Examination of the right-middle field reveals rales. Examination of the right-lower field reveals rales. Rales present. No decreased breath sounds, wheezing or rhonchi.  Abdominal:     General: Bowel sounds are normal. There is no distension.     Palpations: Abdomen is soft.     Tenderness: There is no abdominal tenderness. There is no guarding.  Musculoskeletal:     Right lower leg: No edema.     Left lower leg: No edema.  Skin:    General: Skin is warm and dry.     Comments: On the left lower extremity on the medial aspect, there is an ulcer with evidence of healing with no evidence of surrounding erythema or purulent drainage.  Neurological:     General: No focal deficit present.     Mental Status: She is alert and oriented to person, place, and time. Mental status is at baseline.  Psychiatric:        Mood and Affect: Mood  normal.        Behavior: Behavior normal.        Thought Content: Thought content normal.        Judgment: Judgment normal.    Data Reviewed: CBC with WBC of 18.9, hemoglobin of 12.1, platelets of 238 CMP with sodium 131, potassium 2.5, chloride of 96, bicarb of 24, glucose of 129, creatinine 0.69, calcium of 8.5, GFR over 60, AST 35, ALT 29 Lactic acid within normal limits at 1.4  EKG was personally reviewed.  Sinus rhythm with rate of 99.  No ST or T wave changes concerning for acute ischemia  DG Chest 2 View  Result Date: 02/09/2022 CLINICAL  DATA:  Cough, shortness of breath. EXAM: CHEST - 2 VIEW COMPARISON:  Report from chest radiograph dated 12/25/2014. FINDINGS: The heart size and mediastinal contours are within normal limits. Moderate airspace opacities are seen in the right middle lobe. The left lung is clear. There is no pleural effusion or pneumothorax. The visualized skeletal structures are unremarkable. IMPRESSION: Moderate airspace opacities in the right middle lobe consistent with pneumonia. Electronically Signed   By: Zerita Boers M.D.   On: 02/09/2022 15:42    There are no new results to review at this time.  Assessment and Plan:  * CAP (community acquired pneumonia) Patient presenting with sudden onset nonproductive cough, nausea, vomiting and generalized weakness with chest x-ray demonstrating a right middle lobe opacity.  Given she initially began to feel much better after her COVID infection and then had a rapid deterioration, I am concerned for post viral bacterial pneumonia.  Procalcitonin and WBC markedly elevated.  - Continue supplemental oxygen to maintain oxygen saturation above 88% - Wean as tolerated - Continue ceftriaxone and azithromycin - MRSA PCR - Legionella and strep pneumo urinary antigens - IV fluids as ordered  Hypokalemia In the setting of nausea/vomiting with poor p.o. intake.  No evidence of EKG changes at this time.  - 40 mEq of IV and p.o.  potassium replacement - Repeat BMP this evening - Magnesium pending - Zofran as needed for nausea  Hypotension In the setting of poor p.o. intake for the last several days with significantly dry oropharynx on examination.  Lactic acid is within normal limits, so no suspicion for hypoperfusion at this point.  -Aggressive IV fluid resuscitation as ordered  Diabetes mellitus type 2 with complications ([place]) Currently managed with diet only.  Last A1c 6.6% 4 months ago.  No indication at this time for SSI  Generalized weakness In the setting of recent COVID-19 infection with now postviral bacterial pneumonia.  - PT/OT  HTN (hypertension), benign - Hold home Losartan in the setting of hypotension  Advance Care Planning:   Code Status: Full Code.  When discussing CODE STATUS with Denise Moore and her son, Denise Moore initially stated she would not want to be resuscitated, however her son objected to this and they discussed resuscitation can be attempted, however if there is no chance for meaningful recovery, patient would not want to be on long-term support.  Full code for now  Consults: None  Family Communication: Patient's son updated at bedside  Severity of Illness: The appropriate patient status for this patient is OBSERVATION. Observation status is judged to be reasonable and necessary in order to provide the required intensity of service to ensure the patient's safety. The patient's presenting symptoms, physical exam findings, and initial radiographic and laboratory data in the context of their medical condition is felt to place them at decreased risk for further clinical deterioration. Furthermore, it is anticipated that the patient will be medically stable for discharge from the hospital within 2 midnights of admission.   Author: Jose Persia, MD 02/09/2022 7:11 PM  For on call review [email or web address].

## 2022-02-09 NOTE — Assessment & Plan Note (Addendum)
In the setting of nausea/vomiting with poor p.o. intake.  No evidence of EKG changes at this time.  - 40 mEq of IV and p.o. potassium replacement - Repeat BMP this evening - Magnesium pending - Zofran as needed for nausea

## 2022-02-09 NOTE — Assessment & Plan Note (Addendum)
Patient presenting with sudden onset nonproductive cough, nausea, vomiting and generalized weakness with chest x-ray demonstrating a right middle lobe opacity.  Given she initially began to feel much better after her COVID infection and then had a rapid deterioration, I am concerned for post viral bacterial pneumonia.  Procalcitonin and WBC markedly elevated.  - Continue supplemental oxygen to maintain oxygen saturation above 88% - Wean as tolerated - Continue ceftriaxone and azithromycin - MRSA PCR - Legionella and strep pneumo urinary antigens - IV fluids as ordered

## 2022-02-09 NOTE — Assessment & Plan Note (Signed)
In the setting of recent COVID-19 infection with now postviral bacterial pneumonia.  - PT/OT

## 2022-02-09 NOTE — ED Triage Notes (Signed)
Pt with family who states that she was diagnosed with covid on the 19th and she got better but then a few days ago she got worse- pt has been weak with a deep cough- pt has not been eating or drinking like normal and has been more confused- pt complains of a sore chest when she coughs

## 2022-02-09 NOTE — ED Provider Notes (Signed)
[facility] Provider Note    Event Date/Time   First MD Initiated Contact with Patient 02/09/22 1513     (approximate)   History   Chief Complaint: Weakness   HPI  Denise Moore is a 77 y.o. female with a history of hypertension, diabetes, GERD who reports worsening generalized weakness, shortness of breath and loss of appetite.  2 weeks ago she was diagnosed with COVID, she continues to have cough.  Family notes that she has become so weak that she cannot stand or walk.  Also has been having episodes of confusion over the last few days.  Family noted oxygen saturation of about 80% on room air at home.     Physical Exam   Triage Vital Signs: ED Triage Vitals  Enc Vitals Group     BP 02/09/22 1126 (!) 132/53     Pulse Rate 02/09/22 1126 99     Resp 02/09/22 1126 20     Temp 02/09/22 1126 99.5 F (37.5 C)     Temp Source 02/09/22 1126 Oral     SpO2 02/09/22 1126 (!) 89 %     Weight 02/09/22 1127 140 lb (63.5 kg)     Height 02/09/22 1127 5\' 4"  (1.626 m)     Head Circumference --      Peak Flow --      Pain Score 02/09/22 1127 0     Pain Loc --      Pain Edu? --      Excl. in [place]? --     Most recent vital signs: Vitals:   02/10/22 0000 02/10/22 0030  BP: (!) 119/39 115/67  Pulse: 73 85  Resp: (!) 25 20  Temp:  98.9 F (37.2 C)  SpO2: 95% 92%    General: Awake, no distress.  CV:  Good peripheral perfusion.  Regular rate and rhythm Resp:  Normal effort.  Clear to auscultation bilaterally Abd:  No distention.  Soft nontender Other:  No lower extremity edema.  Dry mucous membranes.   ED Results / Procedures / Treatments   Labs (all labs ordered are listed, but only abnormal results are displayed) Labs Reviewed  CBC - Abnormal; Notable for the following components:      Result Value   WBC 18.9 (*)    All other components within normal limits  URINALYSIS, ROUTINE W REFLEX MICROSCOPIC - Abnormal; Notable for the following components:    Color, Urine STRAW (*)    APPearance CLEAR (*)    Specific Gravity, Urine 1.004 (*)    All other components within normal limits  COMPREHENSIVE METABOLIC PANEL - Abnormal; Notable for the following components:   Sodium 131 (*)    Potassium 2.5 (*)    Chloride 96 (*)    Glucose, Bld 129 (*)    Calcium 8.5 (*)    All other components within normal limits  MRSA NEXT GEN BY PCR, NASAL  EXPECTORATED SPUTUM ASSESSMENT W GRAM STAIN, RFLX TO RESP C  CULTURE, BLOOD (ROUTINE X 2)  CULTURE, BLOOD (ROUTINE X 2)  URINE CULTURE  CULTURE, RESPIRATORY W GRAM STAIN  LACTIC ACID, PLASMA  PROCALCITONIN  MAGNESIUM  HIV ANTIBODY (ROUTINE TESTING W REFLEX)  LEGIONELLA PNEUMOPHILA SEROGP 1 UR AG  STREP PNEUMONIAE URINARY ANTIGEN  BASIC METABOLIC PANEL  CBC WITH DIFFERENTIAL/PLATELET  BASIC METABOLIC PANEL     EKG Interpreted by me Sinus rhythm rate of 99.  Normal axis, intervals, QRS ST segments and T waves   RADIOLOGY  Chest x-ray interpreted by me, shows lobar consolidation of the right middle lobe.  Radiology report reviewed   PROCEDURES:  Procedures   MEDICATIONS ORDERED IN ED: Medications  cefTRIAXone (ROCEPHIN) 2 g in sodium chloride 0.9 % 100 mL IVPB (0 g Intravenous Stopped 02/09/22 1715)  azithromycin (ZITHROMAX) 500 mg in sodium chloride 0.9 % 250 mL IVPB (0 mg Intravenous Stopped 02/09/22 1908)  enoxaparin (LOVENOX) injection 40 mg (40 mg Subcutaneous Given 02/09/22 2308)  lactated ringers bolus 1,000 mL (0 mLs Intravenous Stopped 02/09/22 1956)    Followed by  lactated ringers infusion ( Intravenous New Bag/Given 02/09/22 2047)  ondansetron (ZOFRAN) injection 4 mg (has no administration in time range)  aspirin EC tablet 81 mg (has no administration in time range)  rosuvastatin (CRESTOR) tablet 20 mg (has no administration in time range)  losartan (COZAAR) tablet 50 mg (has no administration in time range)  gabapentin (NEURONTIN) capsule 200 mg (200 mg Oral Given 02/09/22 2308)   guaiFENesin (MUCINEX) 12 hr tablet 600 mg (600 mg Oral Given 02/09/22 1831)  chlorpheniramine-HYDROcodone (TUSSIONEX) 10-8 MG/5ML suspension 2.5 mL (2.5 mLs Oral Given 02/09/22 1831)  sodium chloride 0.9 % bolus 1,000 mL (0 mLs Intravenous Stopped 02/09/22 1716)  potassium chloride 10 mEq in 100 mL IVPB (0 mEq Intravenous Stopped 02/09/22 2153)  potassium chloride (KLOR-CON) packet 40 mEq (40 mEq Oral Given 02/09/22 1613)  lactated ringers bolus 1,000 mL (0 mLs Intravenous Stopped 02/09/22 2043)     IMPRESSION / MDM / ASSESSMENT AND PLAN / ED COURSE  I reviewed the triage vital signs and the nursing notes.                              Differential diagnosis includes, but is not limited to, pneumonia, pleural effusion, viral illness, electrolyte abnormality, AKI, anemia, UTI  Patient's presentation is most consistent with acute presentation with potential threat to life or bodily function.  Patient presents with worsening cough, shortness of breath, weakness.  Found to be hypoxic with oxygen saturation of 89% on room air.  Chest x-ray shows lobar consolidation indicative of new pneumonia.  She is not septic.  Labs reveal leukocytosis as well as a potassium of 2.5.  Patient given oral and IV potassium replacement, IV fluids for hydration, IV antibiotics for pneumonia.  Case discussed with hospitalist for further management       FINAL CLINICAL IMPRESSION(S) / ED DIAGNOSES   Final diagnoses:  Acute respiratory failure with hypoxia ([place])  Community acquired pneumonia of right lung, unspecified part of lung     Rx / DC Orders   ED Discharge Orders     None        Note:  This document was prepared using Dragon voice recognition software and may include unintentional dictation errors.   Carrie Mew, MD 02/10/22 [phone]

## 2022-02-09 NOTE — Assessment & Plan Note (Signed)
In the setting of poor p.o. intake for the last several days with significantly dry oropharynx on examination.  Lactic acid is within normal limits, so no suspicion for hypoperfusion at this point.  -Aggressive IV fluid resuscitation as ordered

## 2022-02-10 ENCOUNTER — Encounter: Payer: Self-pay | Admitting: Internal Medicine

## 2022-02-10 DIAGNOSIS — E860 Dehydration: Secondary | ICD-10-CM | POA: Diagnosis present

## 2022-02-10 DIAGNOSIS — E871 Hypo-osmolality and hyponatremia: Secondary | ICD-10-CM | POA: Diagnosis present

## 2022-02-10 DIAGNOSIS — Z91048 Other nonmedicinal substance allergy status: Secondary | ICD-10-CM | POA: Diagnosis not present

## 2022-02-10 DIAGNOSIS — I9589 Other hypotension: Secondary | ICD-10-CM | POA: Diagnosis present

## 2022-02-10 DIAGNOSIS — Z833 Family history of diabetes mellitus: Secondary | ICD-10-CM | POA: Diagnosis not present

## 2022-02-10 DIAGNOSIS — E1165 Type 2 diabetes mellitus with hyperglycemia: Secondary | ICD-10-CM | POA: Diagnosis present

## 2022-02-10 DIAGNOSIS — E876 Hypokalemia: Secondary | ICD-10-CM | POA: Diagnosis present

## 2022-02-10 DIAGNOSIS — J189 Pneumonia, unspecified organism: Secondary | ICD-10-CM | POA: Diagnosis present

## 2022-02-10 DIAGNOSIS — F17210 Nicotine dependence, cigarettes, uncomplicated: Secondary | ICD-10-CM | POA: Diagnosis present

## 2022-02-10 DIAGNOSIS — K5090 Crohn's disease, unspecified, without complications: Secondary | ICD-10-CM | POA: Diagnosis present

## 2022-02-10 DIAGNOSIS — I6523 Occlusion and stenosis of bilateral carotid arteries: Secondary | ICD-10-CM | POA: Diagnosis present

## 2022-02-10 DIAGNOSIS — E7800 Pure hypercholesterolemia, unspecified: Secondary | ICD-10-CM | POA: Diagnosis present

## 2022-02-10 DIAGNOSIS — Z8249 Family history of ischemic heart disease and other diseases of the circulatory system: Secondary | ICD-10-CM | POA: Diagnosis not present

## 2022-02-10 DIAGNOSIS — Z66 Do not resuscitate: Secondary | ICD-10-CM | POA: Diagnosis not present

## 2022-02-10 DIAGNOSIS — Z7982 Long term (current) use of aspirin: Secondary | ICD-10-CM | POA: Diagnosis not present

## 2022-02-10 DIAGNOSIS — J159 Unspecified bacterial pneumonia: Secondary | ICD-10-CM | POA: Diagnosis present

## 2022-02-10 DIAGNOSIS — Z8616 Personal history of COVID-19: Secondary | ICD-10-CM | POA: Diagnosis not present

## 2022-02-10 DIAGNOSIS — I10 Essential (primary) hypertension: Secondary | ICD-10-CM | POA: Diagnosis present

## 2022-02-10 DIAGNOSIS — K219 Gastro-esophageal reflux disease without esophagitis: Secondary | ICD-10-CM | POA: Diagnosis present

## 2022-02-10 DIAGNOSIS — Z880 Allergy status to penicillin: Secondary | ICD-10-CM | POA: Diagnosis not present

## 2022-02-10 DIAGNOSIS — Z79899 Other long term (current) drug therapy: Secondary | ICD-10-CM | POA: Diagnosis not present

## 2022-02-10 LAB — CBC WITH DIFFERENTIAL/PLATELET
Abs Immature Granulocytes: 0.08 10*3/uL — ABNORMAL HIGH (ref 0.00–0.07)
Basophils Absolute: 0 10*3/uL (ref 0.0–0.1)
Basophils Relative: 0 %
Eosinophils Absolute: 0 10*3/uL (ref 0.0–0.5)
Eosinophils Relative: 0 %
HCT: 29.9 % — ABNORMAL LOW (ref 36.0–46.0)
Hemoglobin: 9.7 g/dL — ABNORMAL LOW (ref 12.0–15.0)
Immature Granulocytes: 1 %
Lymphocytes Relative: 16 %
Lymphs Abs: 2.5 10*3/uL (ref 0.7–4.0)
MCH: 29.9 pg (ref 26.0–34.0)
MCHC: 32.4 g/dL (ref 30.0–36.0)
MCV: 92.3 fL (ref 80.0–100.0)
Monocytes Absolute: 1.6 10*3/uL — ABNORMAL HIGH (ref 0.1–1.0)
Monocytes Relative: 10 %
Neutro Abs: 11.8 10*3/uL — ABNORMAL HIGH (ref 1.7–7.7)
Neutrophils Relative %: 73 %
Platelets: 220 10*3/uL (ref 150–400)
RBC: 3.24 MIL/uL — ABNORMAL LOW (ref 3.87–5.11)
RDW: 13.1 % (ref 11.5–15.5)
WBC: 16 10*3/uL — ABNORMAL HIGH (ref 4.0–10.5)
nRBC: 0 % (ref 0.0–0.2)

## 2022-02-10 LAB — CBG MONITORING, ED
Glucose-Capillary: 135 mg/dL — ABNORMAL HIGH (ref 70–99)
Glucose-Capillary: 111 mg/dL — ABNORMAL HIGH (ref 70–99)

## 2022-02-10 LAB — BASIC METABOLIC PANEL
Anion gap: 9 (ref 5–15)
BUN: 8 mg/dL (ref 8–23)
CO2: 22 mmol/L (ref 22–32)
Calcium: 7.5 mg/dL — ABNORMAL LOW (ref 8.9–10.3)
Chloride: 105 mmol/L (ref 98–111)
Creatinine, Ser: 0.47 mg/dL (ref 0.44–1.00)
GFR, Estimated: >60 mL/min (ref >60)
Glucose, Bld: 108 mg/dL — ABNORMAL HIGH (ref 70–99)
Potassium: 2.9 mmol/L — ABNORMAL LOW (ref 3.5–5.1)
Sodium: 136 mmol/L (ref 135–145)

## 2022-02-10 LAB — PHOSPHORUS: Phosphorus: 2 mg/dL — ABNORMAL LOW (ref 2.5–4.6)

## 2022-02-10 LAB — GLUCOSE, CAPILLARY
Glucose-Capillary: 114 mg/dL — ABNORMAL HIGH (ref 70–99)
Glucose-Capillary: 122 mg/dL — ABNORMAL HIGH (ref 70–99)

## 2022-02-10 LAB — STREP PNEUMONIAE URINARY ANTIGEN: Strep Pneumo Urinary Antigen: NEGATIVE

## 2022-02-10 LAB — HIV ANTIBODY (ROUTINE TESTING W REFLEX): HIV Screen 4th Generation wRfx: NONREACTIVE

## 2022-02-10 LAB — HEMOGLOBIN A1C
Hgb A1c MFr Bld: 5.9 % — ABNORMAL HIGH (ref 4.8–5.6)
Mean Plasma Glucose: 123 mg/dL

## 2022-02-10 MED ORDER — SODIUM CHLORIDE 0.9 % IV SOLN: INTRAVENOUS | Status: AC

## 2022-02-10 MED ORDER — IPRATROPIUM-ALBUTEROL 0.5-2.5 (3) MG/3ML IN SOLN
3.0000 mL | Freq: Two times a day (BID) | RESPIRATORY_TRACT | Status: DC
  Administered 2022-02-10 – 2022-02-11 (×3): 3 mL via RESPIRATORY_TRACT
  Filled 2022-02-10 (×3): qty 3

## 2022-02-10 MED ORDER — TRAZODONE HCL 50 MG PO TABS: 50.0000 mg | ORAL_TABLET | Freq: Every evening | ORAL | Status: DC | PRN

## 2022-02-10 MED ORDER — AZITHROMYCIN 500 MG PO TABS
500.0000 mg | ORAL_TABLET | Freq: Every day | ORAL | Status: DC
  Administered 2022-02-10 – 2022-02-11 (×2): 500 mg via ORAL
  Filled 2022-02-10 (×2): qty 1

## 2022-02-10 MED ORDER — METOPROLOL TARTRATE 5 MG/5ML IV SOLN: 5.0000 mg | INTRAVENOUS | Status: DC | PRN

## 2022-02-10 MED ORDER — K PHOS MONO-SOD PHOS DI & MONO 155-852-130 MG PO TABS
500.0000 mg | ORAL_TABLET | Freq: Two times a day (BID) | ORAL | Status: AC
  Administered 2022-02-10 (×2): 500 mg via ORAL
  Filled 2022-02-10 (×2): qty 2

## 2022-02-10 MED ORDER — HYDRALAZINE HCL 20 MG/ML IJ SOLN: 10.0000 mg | INTRAMUSCULAR | Status: DC | PRN

## 2022-02-10 MED ORDER — IPRATROPIUM-ALBUTEROL 0.5-2.5 (3) MG/3ML IN SOLN: 3.0000 mL | RESPIRATORY_TRACT | Status: DC | PRN

## 2022-02-10 MED ORDER — POTASSIUM CHLORIDE 10 MEQ/100ML IV SOLN
10.0000 meq | INTRAVENOUS | Status: AC
  Administered 2022-02-10 (×4): 10 meq via INTRAVENOUS
  Filled 2022-02-10 (×4): qty 100

## 2022-02-10 MED ORDER — BUDESONIDE 0.5 MG/2ML IN SUSP
0.5000 mg | Freq: Two times a day (BID) | RESPIRATORY_TRACT | Status: DC
  Administered 2022-02-10 – 2022-02-11 (×3): 0.5 mg via RESPIRATORY_TRACT
  Filled 2022-02-10 (×3): qty 2

## 2022-02-10 MED ORDER — POTASSIUM CHLORIDE 20 MEQ PO PACK
40.0000 meq | PACK | Freq: Once | ORAL | Status: AC
  Administered 2022-02-10: 40 meq via ORAL
  Filled 2022-02-10: qty 2

## 2022-02-10 MED ORDER — INSULIN ASPART 100 UNIT/ML IJ SOLN: 0.0000 [IU] | Freq: Three times a day (TID) | INTRAMUSCULAR | Status: DC

## 2022-02-10 NOTE — ED Notes (Signed)
Informed RN bed assigned 

## 2022-02-10 NOTE — Progress Notes (Signed)
OT Cancellation Note  Patient Details Name: Denise Moore MRN: [id number] DOB: [birth year]   Cancelled Treatment:    Reason Eval/Treat Not Completed: OT screened, no needs identified, will sign off. Order received, chart reviewed. Consulted with PT and with pt in ER. Pt is able to perform bed mobility, ambulation, toileting, dressing w/o physical assistance. She reports that she is back to her baseline level of fxl mobility and that she is not in need of rehab services. Will sign off.   Josiah Lobo 02/10/2022, 1:44 PM

## 2022-02-10 NOTE — TOC Initial Note (Signed)
Transition of Care (TOC) - Initial/Assessment Note   Transition of Care [facility]) Screening Note   Patient Details  Name: Denise Moore Date of Birth: 04/19/1945   [facility]) CM/SW Contact:    Shelbie Hutching, RN Phone Number: 02/10/2022, 2:46 PM    Transition of Care Department [facility]) has reviewed patient and no TOC needs have been identified at this time. We will continue to monitor patient advancement through interdisciplinary progression rounds. If new patient transition needs arise, please place a TOC consult.    Patient Details  Name: Denise Moore MRN: [id number] Date of Birth: 05/09/1945  Transition of Care [facility]) CM/SW Contact:    Shelbie Hutching, RN Phone Number: 02/10/2022, 2:45 PM  Clinical Narrative:                         Patient Goals and CMS Choice            Expected Discharge Plan and Services                                              Prior Living Arrangements/Services                       Activities of Daily Living Home Assistive Devices/Equipment: Eyeglasses, Gilford Rile (specify type) ADL Screening (condition at time of admission) Patient's cognitive ability adequate to safely complete daily activities?: Yes Is the patient deaf or have difficulty hearing?: No Does the patient have difficulty seeing, even when wearing glasses/contacts?: No Does the patient have difficulty concentrating, remembering, or making decisions?: No Patient able to express need for assistance with ADLs?: Yes Does the patient have difficulty dressing or bathing?: No Independently performs ADLs?: No Communication: Independent Dressing (OT): Independent Grooming: Independent Feeding: Independent Bathing: Independent Toileting: Needs assistance Is this a change from baseline?: Change from baseline, expected to last <3 days In/Out Bed: Needs assistance Is this a change from baseline?: Change from baseline, expected to last <3 days Walks in  Home: Needs assistance Is this a change from baseline?: Change from baseline, expected to last >3 days Does the patient have difficulty walking or climbing stairs?: Yes Weakness of Legs: Both Weakness of Arms/Hands: None  Permission Sought/Granted                  Emotional Assessment              Admission diagnosis:  CAP (community acquired pneumonia) [J18.9] Patient Active Problem List   Diagnosis Date Noted   CAP (community acquired pneumonia) 02/09/2022   Generalized weakness 02/09/2022   Hypokalemia 02/09/2022   Hypotension 02/09/2022   Diabetes mellitus type 2 with complications ([place]) 71/24/5809   Carotid artery disease ([place]) 10/14/2020   Crohn's disease of both small and large intestine ([place]) 11/24/2016   Crohn's colitis, without complications ([place]) 98/33/8250   Elevated anti-tissue transglutaminase (tTG) IgA level    Ulcerative pancolitis without complication ([place])    Blood pressure elevated without history of HTN 08/13/2015   HTN (hypertension), benign 08/13/2015   Pancreatic insufficiency 03/07/2015   Pure hypercholesterolemia 10/23/2014   PCP:  Kirk Ruths, MD Pharmacy:   [facility] [phone] - [place], [place] AT [facility] OF [address] [place] [address] Phone:  [phone] Fax: [phone]  [facility] [phone] - [address], [place] [address] [address] [address] [place] [address] Phone: [phone] Fax: [phone]     Social Determinants of Health (SDOH) Social History: [place]: No Food Insecurity (02/10/2022)  Housing: Low Risk  (02/10/2022)  Transportation Needs: No Transportation Needs (02/10/2022)  Utilities: Not At Risk (02/10/2022)  Tobacco Use: High Risk (02/10/2022)   SDOH Interventions:     Readmission Risk Interventions     No data to display

## 2022-02-10 NOTE — ED Notes (Signed)
Pt moved to hospital bed.

## 2022-02-10 NOTE — Progress Notes (Signed)
PROGRESS NOTE    Denise Moore  ZYS:[id number] DOB: 1945/11/01 DOA: 02/09/2022 PCP: Kirk Ruths, MD   Brief Narrative:  77 year old with history of Crohn's, HTN, HLD, carotid artery stenosis recently had COVID on 12/19 with URI symptoms and started improving on 12/31.  Thereafter about 5 days prior to admission started having generalized weakness, nausea and vomiting with poor oral intake.  Upon admission chest x-ray showed mild right middle lobe opacity and lab work showed hypokalemia.  She is admitted to the hospital.   Assessment & Plan:  Principal Problem:   CAP (community acquired pneumonia) Active Problems:   Hypokalemia   Hypotension   Diabetes mellitus type 2 with complications (HCC)   Generalized weakness   HTN (hypertension), benign     Assessment and Plan: * CAP (community acquired pneumonia) Likely post-COVID superimposed bacterial pneumonia.  Procalcitonin 0.61.  Will check BNP.  She does have leukocytosis as well.  Continue Rocephin/azithromycin, total of 5 days Strep pneumo urine antigen is negative. Bronchodilators/flutter valve/incentive spirometer  Hypokalemia Likely causing her weakness.  Aggressive repletion.  Magnesium is normal.  Will check phosphorus Replete as needed  Hypotension, improved From dehydration, getting IV fluids  Diabetes mellitus type 2 with complications ([place]) Currently managed with diet only.  Last A1c 6.6% 4 months ago.  Will place her on sliding scale and Accu-Cheks  Generalized weakness Secondary to severe hypokalemia and recent illness.  PT/OT.  Should improve with repletion and potassium  HTN (hypertension), benign Resume losartan.  IV as needed   DVT prophylaxis: Lovenox Code Status: DNR Family Communication: Son and brother at bedside  Patient is still feeling quite weak and has some exertional dyspnea.  Continue hospital stay     Subjective: Seen and examined at bedside, feels a little better compared to  yesterday but still having some exertional dyspnea and cough.   Examination:  General exam: Appears calm and comfortable  Respiratory system: b/l rhonchi Cardiovascular system: S1 & S2 heard, RRR. No JVD, murmurs, rubs, gallops or clicks. No pedal edema. Gastrointestinal system: Abdomen is nondistended, soft and nontender. No organomegaly or masses felt. Normal bowel sounds heard. Central nervous system: Alert and oriented. No focal neurological deficits. Extremities: Symmetric 5 x 5 power. Skin: No rashes, lesions or ulcers Psychiatry: Judgement and insight appear normal. Mood & affect appropriate.    Objective: Vitals:   02/10/22 0740 02/10/22 0741 02/10/22 0800 02/10/22 0900  BP:   (!) 120/49 (!) 111/56  Pulse: 67  64 71  Resp: 17  19 (!) 22  Temp:  97.6 F (36.4 C)    TempSrc:  Oral    SpO2: 96%  96% 96%  Weight:      Height:        Intake/Output Summary (Last 24 hours) at 02/10/2022 0915 Last data filed at 02/10/2022 0717 Gross per 24 hour  Intake 1200.41 ml  Output --  Net 1200.41 ml   Filed Weights   02/09/22 1127  Weight: 63.5 kg     Data Reviewed:   CBC: Recent Labs  Lab 02/09/22 1136 02/10/22 0214  WBC 18.9* 16.0*  NEUTROABS  --  11.8*  HGB 12.1 9.7*  HCT 36.0 29.9*  MCV 90.7 92.3  PLT 238 932   Basic Metabolic Panel: Recent Labs  Lab 02/09/22 1136 02/10/22 0214  NA 131* 136  K 2.5* 2.9*  CL 96* 105  CO2 24 22  GLUCOSE 129* 108*  BUN 10 8  CREATININE 0.69 0.47  CALCIUM 8.5* 7.5*  MG 2.0  --    GFR: Estimated Creatinine Clearance: 51.7 mL/min (by C-G formula based on SCr of 0.47 mg/dL). Liver Function Tests: Recent Labs  Lab 02/09/22 1136  AST 35  ALT 29  ALKPHOS 111  BILITOT 1.1  PROT 7.7  ALBUMIN 3.5   No results for input(s): "LIPASE", "AMYLASE" in the last 168 hours. No results for input(s): "AMMONIA" in the last 168 hours. Coagulation Profile: No results for input(s): "INR", "PROTIME" in the last 168 hours. Cardiac  Enzymes: No results for input(s): "CKTOTAL", "CKMB", "CKMBINDEX", "TROPONINI" in the last 168 hours. BNP (last 3 results) No results for input(s): "PROBNP" in the last 8760 hours. HbA1C: No results for input(s): "HGBA1C" in the last 72 hours. CBG: No results for input(s): "GLUCAP" in the last 168 hours. Lipid Profile: No results for input(s): "CHOL", "HDL", "LDLCALC", "TRIG", "CHOLHDL", "LDLDIRECT" in the last 72 hours. Thyroid Function Tests: No results for input(s): "TSH", "T4TOTAL", "FREET4", "T3FREE", "THYROIDAB" in the last 72 hours. Anemia Panel: No results for input(s): "VITAMINB12", "FOLATE", "FERRITIN", "TIBC", "IRON", "RETICCTPCT" in the last 72 hours. Sepsis Labs: Recent Labs  Lab 02/09/22 1136 02/09/22 1539  PROCALCITON 0.61  --   LATICACIDVEN  --  1.4    Recent Results (from the past 240 hour(s))  Blood Culture (routine x 2)     Status: None (Preliminary result)   Collection Time: 02/09/22  3:39 PM   Specimen: BLOOD  Result Value Ref Range Status   Specimen Description BLOOD LEFT ARM  Final   Special Requests   Final    BOTTLES DRAWN AEROBIC AND ANAEROBIC Blood Culture adequate volume   Culture   Final    NO GROWTH < 24 HOURS Performed at [facility], [address] [place], [place] [address]    Report Status PENDING  Incomplete  Blood Culture (routine x 2)     Status: None (Preliminary result)   Collection Time: 02/09/22  3:39 PM   Specimen: BLOOD RIGHT FOREARM  Result Value Ref Range Status   Specimen Description BLOOD RIGHT FOREARM  Final   Special Requests   Final    BOTTLES DRAWN AEROBIC AND ANAEROBIC Blood Culture adequate volume   Culture   Final    NO GROWTH < 24 HOURS Performed at [facility], [address], [place], [place] [address]    Report Status PENDING  Incomplete  Expectorated Sputum Assessment w Gram Stain, Rflx to Resp Cult     Status: None   Collection Time: 02/09/22  6:11 PM   Specimen: Sputum  Result Value  Ref Range Status   Specimen Description SPUTUM  Final   Special Requests NONE  Final   Sputum evaluation   Final    THIS SPECIMEN IS ACCEPTABLE FOR SPUTUM CULTURE Performed at [facility], [address], [place], [place] [address]    Report Status 02/09/2022 FINAL  Final  MRSA Next Gen by PCR, Nasal     Status: None   Collection Time: 02/09/22  6:34 PM   Specimen: Nasal Mucosa; Nasal Swab  Result Value Ref Range Status   MRSA by PCR Next Gen NOT DETECTED NOT DETECTED Final    Comment: (NOTE) The GeneXpert MRSA Assay (FDA approved for NASAL specimens only), is one component of a comprehensive MRSA colonization surveillance program. It is not intended to diagnose MRSA infection nor to guide or monitor treatment for MRSA infections. Test performance is not FDA approved in patients less than 67 years old. Performed  at [facility], [address], [place], [place] [address]          Radiology Studies: DG Chest 2 View  Result Date: 02/09/2022 CLINICAL DATA:  Cough, shortness of breath. EXAM: CHEST - 2 VIEW COMPARISON:  Report from chest radiograph dated 12/25/2014. FINDINGS: The heart size and mediastinal contours are within normal limits. Moderate airspace opacities are seen in the right middle lobe. The left lung is clear. There is no pleural effusion or pneumothorax. The visualized skeletal structures are unremarkable. IMPRESSION: Moderate airspace opacities in the right middle lobe consistent with pneumonia. Electronically Signed   By: Zerita Boers M.D.   On: 02/09/2022 15:42        Scheduled Meds:  aspirin EC  81 mg Oral Daily   enoxaparin (LOVENOX) injection  40 mg Subcutaneous Q24H   gabapentin  200 mg Oral BID   guaiFENesin  600 mg Oral BID   losartan  50 mg Oral Daily   rosuvastatin  20 mg Oral QPM   Continuous Infusions:  azithromycin Stopped (02/09/22 1908)   cefTRIAXone (ROCEPHIN)  IV Stopped (02/09/22 1715)     LOS: 0 days   Time  spent= 35 mins    Macgregor Aeschliman Arsenio Loader, MD [facility]  If 7PM-7AM, please contact night-coverage  02/10/2022, 9:15 AM

## 2022-02-10 NOTE — ED Notes (Signed)
Pt found out of bed with one IV ripped out. patient states that she needed to have diarrhea and couldn't wait to hit the call light. Pt reeducated about use of call bell

## 2022-02-10 NOTE — Evaluation (Signed)
Physical Therapy Evaluation Patient Details Name: Denise Moore MRN: [id number] DOB: 09-27-1945 Today's Date: 02/10/2022  History of Present Illness  Denise Moore is a 52yoF who comes to [facility] on 02/09/22 c SOB, weakness, decreased appetite. Pt diagnosed with COVID infection 2 weeks prior (12/19), has required assistance from family for mobility since.  Clinical Impression  Now that pt has had the majority of her electrolyte replacement completed she feels nearly back to baseline with exception to some lingering fatigue/weakness which is more than likely just from being inactive for several days. Pt able to mobilize with confidence in hallway without device ro difficulty, VSS. Pt denies any need for services at DC. None of my screening in this visit indicates any indication for skilled PT services here or at DC. Encouraged pt to contact PCP regarding lingering impairment in a couple weeks should she still feel not fully recovered. OPT signing off. Son and brother in room for most of session. Pt denies any need to see OT here, which is relayed to OT.      Recommendations for follow up therapy are one component of a multi-disciplinary discharge planning process, led by the attending physician.  Recommendations may be updated based on patient status, additional functional criteria and insurance authorization.  Follow Up Recommendations No PT follow up      Assistance Recommended at Discharge None  Patient can return home with the following  Help with stairs or ramp for entrance;Assistance with cooking/housework    Equipment Recommendations None recommended by PT  Recommendations for Other Services       Functional Status Assessment Patient has had a recent decline in their functional status and demonstrates the ability to make significant improvements in function in a reasonable and predictable amount of time.     Precautions / Restrictions Precautions Precautions: Fall Restrictions Weight  Bearing Restrictions: No      Mobility  Bed Mobility Overal bed mobility: Independent                  Transfers Overall transfer level: Independent Equipment used: None                    Ambulation/Gait Ambulation/Gait assistance: Modified independent (Device/Increase time) Gait Distance (Feet): 400 Feet Assistive device: None Gait Pattern/deviations: WFL(Within Functional Limits)       General Gait Details: slower than baseline, but well tolerated, no dizziness, LOB, no desaturation.  Stairs            Wheelchair Mobility    Modified Rankin (Stroke Patients Only)       Balance                                             Pertinent Vitals/Pain Pain Assessment Pain Assessment: No/denies pain    Home Living Family/patient expects to be discharged to:: Private residence Living Arrangements: Children (Son, works during the day) Available Help at Discharge: Family Type of Home: House Home Access: Stairs to enter Entrance Stairs-Rails: None Technical brewer of Steps: 2   Home Layout: One level Home Equipment: None      Prior Function Prior Level of Function : Independent/Modified Independent;Driving             Mobility Comments: typically modI, still drives makes meals.       Hand Dominance        Extremity/Trunk  Assessment                Communication      Cognition Arousal/Alertness: Awake/alert Behavior During Therapy: WFL for tasks assessed/performed Overall Cognitive Status: Within Functional Limits for tasks assessed                                          General Comments      Exercises     Assessment/Plan    PT Assessment Patient does not need any further PT services  PT Problem List         PT Treatment Interventions      PT Goals (Current goals can be found in the Care Plan section)  Acute Rehab PT Goals PT Goal Formulation: All assessment and  education complete, DC therapy    Frequency       Co-evaluation               AM-PAC PT "6 Clicks" Mobility  Outcome Measure Help needed turning from your back to your side while in a flat bed without using bedrails?: None Help needed moving from lying on your back to sitting on the side of a flat bed without using bedrails?: None Help needed moving to and from a bed to a chair (including a wheelchair)?: None Help needed standing up from a chair using your arms (e.g., wheelchair or bedside chair)?: None Help needed to walk in hospital room?: None Help needed climbing 3-5 steps with a railing? : A Little 6 Click Score: 23    End of Session   Activity Tolerance: Patient tolerated treatment well Patient left: in chair;with family/visitor present;with call bell/phone within reach Nurse Communication: Mobility status PT Visit Diagnosis: Other symptoms and signs involving the nervous system (R29.898)    Time: 3007-6226 PT Time Calculation (min) (ACUTE ONLY): 23 min   Charges:   PT Evaluation $PT Eval Low Complexity: 1 Low         1:44 PM, 02/10/22 Etta Grandchild, PT, DPT Physical Therapist - [facility]  [phone] ([place])    Denise Moore C 02/10/2022, 1:41 PM

## 2022-02-11 DIAGNOSIS — J189 Pneumonia, unspecified organism: Secondary | ICD-10-CM | POA: Diagnosis not present

## 2022-02-11 LAB — MAGNESIUM: Magnesium: 1.8 mg/dL (ref 1.7–2.4)

## 2022-02-11 LAB — BASIC METABOLIC PANEL
Anion gap: 8 (ref 5–15)
BUN: 7 mg/dL — ABNORMAL LOW (ref 8–23)
CO2: 24 mmol/L (ref 22–32)
Calcium: 7.7 mg/dL — ABNORMAL LOW (ref 8.9–10.3)
Chloride: 103 mmol/L (ref 98–111)
Creatinine, Ser: 0.61 mg/dL (ref 0.44–1.00)
GFR, Estimated: >60 mL/min (ref >60)
Glucose, Bld: 101 mg/dL — ABNORMAL HIGH (ref 70–99)
Potassium: 3 mmol/L — ABNORMAL LOW (ref 3.5–5.1)
Sodium: 135 mmol/L (ref 135–145)

## 2022-02-11 LAB — CBC
HCT: 27.9 % — ABNORMAL LOW (ref 36.0–46.0)
Hemoglobin: 9.3 g/dL — ABNORMAL LOW (ref 12.0–15.0)
MCH: 30.5 pg (ref 26.0–34.0)
MCHC: 33.3 g/dL (ref 30.0–36.0)
MCV: 91.5 fL (ref 80.0–100.0)
Platelets: 246 10*3/uL (ref 150–400)
RBC: 3.05 MIL/uL — ABNORMAL LOW (ref 3.87–5.11)
RDW: 13.2 % (ref 11.5–15.5)
WBC: 11.9 10*3/uL — ABNORMAL HIGH (ref 4.0–10.5)
nRBC: 0 % (ref 0.0–0.2)

## 2022-02-11 LAB — URINE CULTURE

## 2022-02-11 LAB — GLUCOSE, CAPILLARY: Glucose-Capillary: 120 mg/dL — ABNORMAL HIGH (ref 70–99)

## 2022-02-11 LAB — LEGIONELLA PNEUMOPHILA SEROGP 1 UR AG: L. pneumophila Serogp 1 Ur Ag: NEGATIVE

## 2022-02-11 MED ORDER — POTASSIUM CHLORIDE 20 MEQ PO PACK
40.0000 meq | PACK | ORAL | Status: DC
  Administered 2022-02-11: 40 meq via ORAL
  Filled 2022-02-11: qty 2

## 2022-02-11 MED ORDER — POTASSIUM CHLORIDE 20 MEQ PO PACK: 40.0000 meq | PACK | Freq: Every day | ORAL | 0 refills | Status: DC

## 2022-02-11 MED ORDER — POTASSIUM PHOSPHATES 15 MMOLE/5ML IV SOLN
30.0000 mmol | Freq: Once | INTRAVENOUS | Status: AC
  Administered 2022-02-11: 30 mmol via INTRAVENOUS
  Filled 2022-02-11: qty 10

## 2022-02-11 MED ORDER — POTASSIUM & SODIUM PHOSPHATES 280-160-250 MG PO PACK
1.0000 | PACK | Freq: Once | ORAL | Status: DC
  Filled 2022-02-11: qty 1

## 2022-02-11 MED ORDER — DOXYCYCLINE MONOHYDRATE 100 MG PO TABS: 100.0000 mg | ORAL_TABLET | Freq: Two times a day (BID) | ORAL | 0 refills | Status: AC

## 2022-02-11 NOTE — Discharge Summary (Signed)
Physician Discharge Summary  Denise Moore JXB:[id number] DOB: 13-Apr-1945 DOA: 02/09/2022  PCP: Kirk Ruths, MD  Admit date: 02/09/2022 Discharge date: 02/11/2022  Admitted From: Home Disposition: Home  Recommendations for Outpatient Follow-up:  Follow up with PCP in 1-2 weeks Please obtain BMP/CBC in one week your next doctors visit.  Potassium supplements prescribed for 2 days Oral doxycycline for 3 days   Discharge Condition: Stable CODE STATUS: DNR Diet recommendation: Diabetic  Brief/Interim Summary: 77 year old with history of Crohn's, HTN, HLD, carotid artery stenosis recently had COVID on 12/19 with URI symptoms and started improving on 12/31.  Thereafter about 5 days prior to admission started having generalized weakness, nausea and vomiting with poor oral intake.  Upon admission chest x-ray showed mild right middle lobe opacity and lab work showed hypokalemia.  She is admitted to the hospital.  During hospitalization she was started on Rocephin and azithromycin which was eventually transitioned to oral doxycycline upon discharge.  Her electrolytes.  Today she is medically stable for discharge     Assessment & Plan:  Principal Problem:   CAP (community acquired pneumonia) Active Problems:   Hypokalemia   Hypotension   Diabetes mellitus type 2 with complications (HCC)   Generalized weakness   HTN (hypertension), benign       Assessment and Plan: * CAP (community acquired pneumonia) Likely post-COVID superimposed bacterial pneumonia.  Procalcitonin 0.61.  Will transition oral Rocephin/azithromycin to p.o. doxycycline for 3 more days. Strep pneumo urine antigen is negative. Bronchodilators/flutter valve/incentive spirometer   Hypokalemia/hypophosphatemia Electro repletion.  Upon discharge she has been given 2 days of potassium.  Advised to repeat with PCP in 1 week   Hypotension, improved From dehydration, getting IV fluids   Diabetes mellitus type 2 with  complications ([name]) Currently managed with diet only.  Last A1c 6.6% 4 months ago.     Generalized weakness Patient did well with physical therapy, no follow-up was necessary   HTN (hypertension), benign Resume her home regimen      Discharge Diagnoses:  Principal Problem:   CAP (community acquired pneumonia) Active Problems:   Hypokalemia   Hypotension   Diabetes mellitus type 2 with complications (HCC)   Generalized weakness   HTN (hypertension), benign      Consultations: None Subjective: Feels great wishes to go home.  Family at bedside who confirms that she is at her baseline  Discharge Exam: Vitals:   02/11/22 0849 02/11/22 0850  BP:    Pulse:    Resp:    Temp:    SpO2: 90% (!) 89%   Vitals:   02/11/22 0800 02/11/22 0845 02/11/22 0849 02/11/22 0850  BP: (!) 132/54     Pulse:      Resp:      Temp:      TempSrc:      SpO2:  90% 90% (!) 89%  Weight:      Height:        General: Pt is alert, awake, not in acute distress Cardiovascular: RRR, S1/S2 +, no rubs, no gallops Respiratory: CTA bilaterally, no wheezing, no rhonchi Abdominal: Soft, NT, ND, bowel sounds + Extremities: no edema, no cyanosis  Discharge Instructions   Allergies as of 02/11/2022       Reactions   Other Itching   Bandaids  Bandaids    Penicillins Rash   swelling        Medication List     STOP taking these medications    acetaminophen 650 MG CR tablet Commonly  known as: TYLENOL   aspirin EC 81 MG tablet   ibuprofen 200 MG tablet Commonly known as: ADVIL       TAKE these medications    Cetirizine HCl 10 MG Caps Take by mouth.   Cholecalciferol 25 MCG (1000 UT) tablet Take by mouth.   doxycycline 100 MG tablet Commonly known as: ADOXA Take 1 tablet (100 mg total) by mouth 2 (two) times daily for 3 days.   gabapentin 100 MG capsule Commonly known as: NEURONTIN Take 200 mg by mouth 2 (two) times daily.   losartan 50 MG tablet Commonly known as:  COZAAR Take 50 mg by mouth daily.   potassium chloride 20 MEQ packet Commonly known as: KLOR-CON Take 40 mEq by mouth daily for 2 days.   rosuvastatin 20 MG tablet Commonly known as: CRESTOR Take by mouth.   Turmeric 1053 MG Tabs Take by mouth.        Follow-up Information     Kirk Ruths, MD Follow up in 1 week(s).   Specialty: Internal Medicine Why: Patient to make own appointment Contact information: [address] [facility] [place] [address] [phone]                Allergies  Allergen Reactions   Other Itching    Bandaids  Bandaids    Penicillins Rash    swelling    You were cared for by a hospitalist during your hospital stay. If you have any questions about your discharge medications or the care you received while you were in the hospital after you are discharged, you can call the unit and asked to speak with the hospitalist on call if the hospitalist that took care of you is not available. Once you are discharged, your primary care physician will handle any further medical issues. Please note that no refills for any discharge medications will be authorized once you are discharged, as it is imperative that you return to your primary care physician (or establish a relationship with a primary care physician if you do not have one) for your aftercare needs so that they can reassess your need for medications and monitor your lab values.   Procedures/Studies: DG Chest 2 View  Result Date: 02/09/2022 CLINICAL DATA:  Cough, shortness of breath. EXAM: CHEST - 2 VIEW COMPARISON:  Report from chest radiograph dated 12/25/2014. FINDINGS: The heart size and mediastinal contours are within normal limits. Moderate airspace opacities are seen in the right middle lobe. The left lung is clear. There is no pleural effusion or pneumothorax. The visualized skeletal structures are unremarkable. IMPRESSION: Moderate airspace opacities in the  right middle lobe consistent with pneumonia. Electronically Signed   By: Zerita Boers M.D.   On: 02/09/2022 15:42     The results of significant diagnostics from this hospitalization (including imaging, microbiology, ancillary and laboratory) are listed below for reference.     Microbiology: Recent Results (from the past 240 hour(s))  Blood Culture (routine x 2)     Status: None (Preliminary result)   Collection Time: 02/09/22  3:39 PM   Specimen: BLOOD  Result Value Ref Range Status   Specimen Description BLOOD LEFT ARM  Final   Special Requests   Final    BOTTLES DRAWN AEROBIC AND ANAEROBIC Blood Culture adequate volume   Culture   Final    NO GROWTH 2 DAYS Performed at [facility], [address], [place], [place] [address]    Report Status  PENDING  Incomplete  Blood Culture (routine x 2)     Status: None (Preliminary result)   Collection Time: 02/09/22  3:39 PM   Specimen: BLOOD RIGHT FOREARM  Result Value Ref Range Status   Specimen Description BLOOD RIGHT FOREARM  Final   Special Requests   Final    BOTTLES DRAWN AEROBIC AND ANAEROBIC Blood Culture adequate volume   Culture   Final    NO GROWTH 2 DAYS Performed at [facility], [address], [place], [place] [address]    Report Status PENDING  Incomplete  Expectorated Sputum Assessment w Gram Stain, Rflx to Resp Cult     Status: None   Collection Time: 02/09/22  6:11 PM   Specimen: Sputum  Result Value Ref Range Status   Specimen Description SPUTUM  Final   Special Requests NONE  Final   Sputum evaluation   Final    THIS SPECIMEN IS ACCEPTABLE FOR SPUTUM CULTURE Performed at [facility], [address] [place], [place] [address]    Report Status 02/09/2022 FINAL  Final  Culture, Respiratory w Gram Stain     Status: None (Preliminary result)   Collection Time: 02/09/22  6:11 PM   Specimen: SPU  Result Value Ref Range Status   Specimen Description   Final    SPUTUM Performed at  [facility], [address], [place], [place] [address]    Special Requests   Final    NONE Reflexed from [id number] Performed at [facility], [address] [place], [place] [address]    Gram Stain   Final    ABUNDANT SQUAMOUS EPITHELIAL CELLS PRESENT RARE Lonell Grandchild NEGATIVE RODS FEW GRAM POSITIVE RODS FEW GRAM POSITIVE COCCI IN PAIRS    Culture   Final    CULTURE REINCUBATED FOR BETTER GROWTH Performed at [facility], [place] [address] [place], [place] [address]    Report Status PENDING  Incomplete  MRSA Next Gen by PCR, Nasal     Status: None   Collection Time: 02/09/22  6:34 PM   Specimen: Nasal Mucosa; Nasal Swab  Result Value Ref Range Status   MRSA by PCR Next Gen NOT DETECTED NOT DETECTED Final    Comment: (NOTE) The GeneXpert MRSA Assay (FDA approved for NASAL specimens only), is one component of a comprehensive MRSA colonization surveillance program. It is not intended to diagnose MRSA infection nor to guide or monitor treatment for MRSA infections. Test performance is not FDA approved in patients less than 77 years old. Performed at [facility], [address] [place], [place] [address]   Urine Culture     Status: Abnormal   Collection Time: 02/09/22  8:09 PM   Specimen: In/Out Cath Urine  Result Value Ref Range Status   Specimen Description   Final    IN/OUT CATH URINE Performed at [facility], [address], [place], [place] [address]    Special Requests   Final    NONE Performed at [facility], [address] [place], [place] [address]    Culture MULTIPLE SPECIES PRESENT, SUGGEST RECOLLECTION (A)  Final   Report Status 02/11/2022 FINAL  Final     Labs: BNP (last 3 results) No results for input(s): "BNP" in the last 8760 hours. Basic Metabolic Panel: Recent Labs  Lab 02/09/22 1136 02/10/22 0213 02/10/22 0214 02/11/22 0516  NA 131*  --  136 135  K 2.5*  --  2.9* 3.0*  CL 96*  --  105 103   CO2 24  --  22 24  GLUCOSE 129*  --  108* 101*  BUN 10  --  8 7*  CREATININE 0.69  --  0.47 0.61  CALCIUM 8.5*  --  7.5* 7.7*  MG 2.0  --   --  1.8  PHOS  --  2.0*  --   --    Liver Function Tests: Recent Labs  Lab 02/09/22 1136  AST 35  ALT 29  ALKPHOS 111  BILITOT 1.1  PROT 7.7  ALBUMIN 3.5   No results for input(s): "LIPASE", "AMYLASE" in the last 168 hours. No results for input(s): "AMMONIA" in the last 168 hours. CBC: Recent Labs  Lab 02/09/22 1136 02/10/22 0214 02/11/22 0516  WBC 18.9* 16.0* 11.9*  NEUTROABS  --  11.8*  --   HGB 12.1 9.7* 9.3*  HCT 36.0 29.9* 27.9*  MCV 90.7 92.3 91.5  PLT 238 220 246   Cardiac Enzymes: No results for input(s): "CKTOTAL", "CKMB", "CKMBINDEX", "TROPONINI" in the last 168 hours. BNP: Invalid input(s): "POCBNP" CBG: Recent Labs  Lab 02/10/22 1151 02/10/22 1626 02/10/22 1801 02/10/22 2051 02/11/22 0750  GLUCAP 135* 111* 114* 122* 120*   D-Dimer No results for input(s): "DDIMER" in the last 72 hours. Hgb A1c Recent Labs    02/10/22 0214  HGBA1C 5.9*   Lipid Profile No results for input(s): "CHOL", "HDL", "LDLCALC", "TRIG", "CHOLHDL", "LDLDIRECT" in the last 72 hours. Thyroid function studies No results for input(s): "TSH", "T4TOTAL", "T3FREE", "THYROIDAB" in the last 72 hours.  Invalid input(s): "FREET3" Anemia work up No results for input(s): "VITAMINB12", "FOLATE", "FERRITIN", "TIBC", "IRON", "RETICCTPCT" in the last 72 hours. Urinalysis    Component Value Date/Time   COLORURINE STRAW (A) 02/09/2022 2009   APPEARANCEUR CLEAR (A) 02/09/2022 2009   LABSPEC 1.004 (L) 02/09/2022 2009   PHURINE 7.0 02/09/2022 2009   GLUCOSEU NEGATIVE 02/09/2022 2009   HGBUR NEGATIVE 02/09/2022 2009   BILIRUBINUR NEGATIVE 02/09/2022 2009   [place] NEGATIVE 02/09/2022 2009   PROTEINUR NEGATIVE 02/09/2022 2009   NITRITE NEGATIVE 02/09/2022 2009   LEUKOCYTESUR NEGATIVE 02/09/2022 2009   Sepsis Labs Recent Labs  Lab  02/09/22 1136 02/10/22 0214 02/11/22 0516  WBC 18.9* 16.0* 11.9*   Microbiology Recent Results (from the past 240 hour(s))  Blood Culture (routine x 2)     Status: None (Preliminary result)   Collection Time: 02/09/22  3:39 PM   Specimen: BLOOD  Result Value Ref Range Status   Specimen Description BLOOD LEFT ARM  Final   Special Requests   Final    BOTTLES DRAWN AEROBIC AND ANAEROBIC Blood Culture adequate volume   Culture   Final    NO GROWTH 2 DAYS Performed at [facility], [address], [place], [place] [address]    Report Status PENDING  Incomplete  Blood Culture (routine x 2)     Status: None (Preliminary result)   Collection Time: 02/09/22  3:39 PM   Specimen: BLOOD RIGHT FOREARM  Result Value Ref Range Status   Specimen Description BLOOD RIGHT FOREARM  Final   Special Requests   Final    BOTTLES DRAWN AEROBIC AND ANAEROBIC Blood Culture adequate volume   Culture   Final    NO GROWTH 2 DAYS Performed at [facility], [address] [place],  [address]    Report Status PENDING  Incomplete  Expectorated Sputum Assessment w Gram Stain, Rflx to Resp Cult     Status: None  Collection Time: 02/09/22  6:11 PM   Specimen: Sputum  Result Value Ref Range Status   Specimen Description SPUTUM  Final   Special Requests NONE  Final   Sputum evaluation   Final    THIS SPECIMEN IS ACCEPTABLE FOR SPUTUM CULTURE Performed at [facility], [address], [place], [place] [address]    Report Status 02/09/2022 FINAL  Final  Culture, Respiratory w Gram Stain     Status: None (Preliminary result)   Collection Time: 02/09/22  6:11 PM   Specimen: SPU  Result Value Ref Range Status   Specimen Description   Final    SPUTUM Performed at [facility], [address] [place] [address] [place], [place] [address]    Special Requests   Final    NONE Reflexed from [id number] Performed at [place] [place], [place] [address]     Gram Stain   Final    ABUNDANT SQUAMOUS EPITHELIAL CELLS PRESENT RARE Lonell Grandchild NEGATIVE RODS FEW GRAM POSITIVE RODS FEW GRAM POSITIVE COCCI IN PAIRS    Culture   Final    CULTURE REINCUBATED FOR BETTER GROWTH Performed at [facility], [place] [address] [place], [place] [address]    Report Status PENDING  Incomplete  MRSA Next Gen by PCR, Nasal     Status: None   Collection Time: 02/09/22  6:34 PM   Specimen: Nasal Mucosa; Nasal Swab  Result Value Ref Range Status   MRSA by PCR Next Gen NOT DETECTED NOT DETECTED Final    Comment: (NOTE) The GeneXpert MRSA Assay (FDA approved for NASAL specimens only), is one component of a comprehensive MRSA colonization surveillance program. It is not intended to diagnose MRSA infection nor to guide or monitor treatment for MRSA infections. Test performance is not FDA approved in patients less than 36 years old. Performed at [facility], [address] [place], [place] [address]   Urine Culture     Status: Abnormal   Collection Time: 02/09/22  8:09 PM   Specimen: In/Out Cath Urine  Result Value Ref Range Status   Specimen Description   Final    IN/OUT CATH URINE Performed at [facility], [address], [place], [place] [address]    Special Requests   Final    NONE Performed at [facility], [address] [place], [place] [address]    Culture MULTIPLE SPECIES PRESENT, SUGGEST RECOLLECTION (A)  Final   Report Status 02/11/2022 FINAL  Final     Time coordinating discharge:  I have spent 35 minutes face to face with the patient and on the ward discussing the patients care, assessment, plan and disposition with other care givers. >50% of the time was devoted counseling the patient about the risks and benefits of treatment/Discharge disposition and coordinating care.   SIGNED:   Damita Lack, MD  [facility] 02/11/2022, 11:52 AM   If 7PM-7AM, please contact night-coverage

## 2022-02-11 NOTE — Progress Notes (Signed)
Patient requesting to finish eating her lunch prior to d/c home.

## 2022-02-11 NOTE — Progress Notes (Signed)
Patient's ambulatory SPO2 is 89 % RA  At rest  90% RA

## 2022-02-12 LAB — CULTURE, RESPIRATORY W GRAM STAIN: Culture: NORMAL

## 2022-02-14 LAB — CULTURE, BLOOD (ROUTINE X 2)
Culture: NO GROWTH
Culture: NO GROWTH
Special Requests: ADEQUATE
Special Requests: ADEQUATE

## 2022-03-06 ENCOUNTER — Other Ambulatory Visit: Payer: Self-pay

## 2022-03-06 ENCOUNTER — Emergency Department: Payer: Medicare Other

## 2022-03-06 ENCOUNTER — Inpatient Hospital Stay: Payer: Medicare Other

## 2022-03-06 ENCOUNTER — Inpatient Hospital Stay
Admission: EM | Admit: 2022-03-06 | Discharge: 2022-03-08 | DRG: 080 | Disposition: A | Discharge status: Hospice | Payer: Medicare Other | Source: Ambulatory Visit | Attending: Internal Medicine | Admitting: Internal Medicine

## 2022-03-06 DIAGNOSIS — G9341 Metabolic encephalopathy: Secondary | ICD-10-CM | POA: Diagnosis present

## 2022-03-06 DIAGNOSIS — Z8701 Personal history of pneumonia (recurrent): Secondary | ICD-10-CM

## 2022-03-06 DIAGNOSIS — I2510 Atherosclerotic heart disease of native coronary artery without angina pectoris: Secondary | ICD-10-CM | POA: Diagnosis present

## 2022-03-06 DIAGNOSIS — E7800 Pure hypercholesterolemia, unspecified: Secondary | ICD-10-CM | POA: Diagnosis present

## 2022-03-06 DIAGNOSIS — M79601 Pain in right arm: Secondary | ICD-10-CM | POA: Diagnosis present

## 2022-03-06 DIAGNOSIS — K219 Gastro-esophageal reflux disease without esophagitis: Secondary | ICD-10-CM | POA: Diagnosis present

## 2022-03-06 DIAGNOSIS — R590 Localized enlarged lymph nodes: Secondary | ICD-10-CM | POA: Diagnosis present

## 2022-03-06 DIAGNOSIS — G936 Cerebral edema: Secondary | ICD-10-CM | POA: Diagnosis not present

## 2022-03-06 DIAGNOSIS — E279 Disorder of adrenal gland, unspecified: Secondary | ICD-10-CM | POA: Diagnosis present

## 2022-03-06 DIAGNOSIS — R4182 Altered mental status, unspecified: Secondary | ICD-10-CM

## 2022-03-06 DIAGNOSIS — C3490 Malignant neoplasm of unspecified part of unspecified bronchus or lung: Secondary | ICD-10-CM | POA: Diagnosis not present

## 2022-03-06 DIAGNOSIS — I10 Essential (primary) hypertension: Secondary | ICD-10-CM | POA: Diagnosis present

## 2022-03-06 DIAGNOSIS — K5080 Crohn's disease of both small and large intestine without complications: Secondary | ICD-10-CM | POA: Diagnosis present

## 2022-03-06 DIAGNOSIS — R4701 Aphasia: Secondary | ICD-10-CM | POA: Diagnosis present

## 2022-03-06 DIAGNOSIS — J432 Centrilobular emphysema: Secondary | ICD-10-CM | POA: Diagnosis present

## 2022-03-06 DIAGNOSIS — Z79899 Other long term (current) drug therapy: Secondary | ICD-10-CM

## 2022-03-06 DIAGNOSIS — Z8616 Personal history of COVID-19: Secondary | ICD-10-CM

## 2022-03-06 DIAGNOSIS — I6529 Occlusion and stenosis of unspecified carotid artery: Secondary | ICD-10-CM | POA: Diagnosis present

## 2022-03-06 DIAGNOSIS — Z8249 Family history of ischemic heart disease and other diseases of the circulatory system: Secondary | ICD-10-CM

## 2022-03-06 DIAGNOSIS — R918 Other nonspecific abnormal finding of lung field: Secondary | ICD-10-CM

## 2022-03-06 DIAGNOSIS — Z66 Do not resuscitate: Secondary | ICD-10-CM | POA: Diagnosis present

## 2022-03-06 DIAGNOSIS — I160 Hypertensive urgency: Secondary | ICD-10-CM | POA: Diagnosis present

## 2022-03-06 DIAGNOSIS — C3412 Malignant neoplasm of upper lobe, left bronchus or lung: Secondary | ICD-10-CM | POA: Diagnosis present

## 2022-03-06 DIAGNOSIS — K8020 Calculus of gallbladder without cholecystitis without obstruction: Secondary | ICD-10-CM | POA: Diagnosis present

## 2022-03-06 DIAGNOSIS — Z791 Long term (current) use of non-steroidal anti-inflammatories (NSAID): Secondary | ICD-10-CM

## 2022-03-06 DIAGNOSIS — R54 Age-related physical debility: Secondary | ICD-10-CM | POA: Diagnosis present

## 2022-03-06 DIAGNOSIS — F17210 Nicotine dependence, cigarettes, uncomplicated: Secondary | ICD-10-CM | POA: Diagnosis present

## 2022-03-06 DIAGNOSIS — E119 Type 2 diabetes mellitus without complications: Secondary | ICD-10-CM

## 2022-03-06 DIAGNOSIS — Z880 Allergy status to penicillin: Secondary | ICD-10-CM

## 2022-03-06 DIAGNOSIS — C7931 Secondary malignant neoplasm of brain: Secondary | ICD-10-CM

## 2022-03-06 DIAGNOSIS — Z515 Encounter for palliative care: Secondary | ICD-10-CM | POA: Diagnosis not present

## 2022-03-06 DIAGNOSIS — Z7189 Other specified counseling: Secondary | ICD-10-CM | POA: Diagnosis not present

## 2022-03-06 DIAGNOSIS — Z833 Family history of diabetes mellitus: Secondary | ICD-10-CM

## 2022-03-06 LAB — COMPREHENSIVE METABOLIC PANEL
ALT: 14 U/L (ref 0–44)
AST: 20 U/L (ref 15–41)
Albumin: 4.3 g/dL (ref 3.5–5.0)
Alkaline Phosphatase: 68 U/L (ref 38–126)
Anion gap: 14 (ref 5–15)
BUN: 27 mg/dL — ABNORMAL HIGH (ref 8–23)
CO2: 22 mmol/L (ref 22–32)
Calcium: 9.4 mg/dL (ref 8.9–10.3)
Chloride: 98 mmol/L (ref 98–111)
Creatinine, Ser: 0.75 mg/dL (ref 0.44–1.00)
GFR, Estimated: >60 mL/min (ref >60)
Glucose, Bld: 108 mg/dL — ABNORMAL HIGH (ref 70–99)
Potassium: 3.6 mmol/L (ref 3.5–5.1)
Sodium: 134 mmol/L — ABNORMAL LOW (ref 135–145)
Total Bilirubin: 0.9 mg/dL (ref 0.3–1.2)
Total Protein: 8.4 g/dL — ABNORMAL HIGH (ref 6.5–8.1)

## 2022-03-06 LAB — CBC
HCT: 43.2 % (ref 36.0–46.0)
Hemoglobin: 14.5 g/dL (ref 12.0–15.0)
MCH: 29.9 pg (ref 26.0–34.0)
MCHC: 33.6 g/dL (ref 30.0–36.0)
MCV: 89.1 fL (ref 80.0–100.0)
Platelets: 215 10*3/uL (ref 150–400)
RBC: 4.85 MIL/uL (ref 3.87–5.11)
RDW: 13.4 % (ref 11.5–15.5)
WBC: 9.3 10*3/uL (ref 4.0–10.5)
nRBC: 0 % (ref 0.0–0.2)

## 2022-03-06 LAB — AMMONIA: Ammonia: 17 umol/L (ref 9–35)

## 2022-03-06 LAB — TROPONIN I (HIGH SENSITIVITY)
Troponin I (High Sensitivity): 20 ng/L — ABNORMAL HIGH (ref <18)
Troponin I (High Sensitivity): 19 ng/L — ABNORMAL HIGH (ref <18)

## 2022-03-06 MED ORDER — ONDANSETRON HCL 4 MG/2ML IJ SOLN
4.0000 mg | Freq: Once | INTRAMUSCULAR | Status: AC
  Administered 2022-03-06: 4 mg via INTRAVENOUS
  Filled 2022-03-06: qty 2

## 2022-03-06 MED ORDER — ACETAMINOPHEN 325 MG PO TABS: 650.0000 mg | ORAL_TABLET | ORAL | Status: DC | PRN

## 2022-03-06 MED ORDER — ORAL CARE MOUTH RINSE: 15.0000 mL | OROMUCOSAL | Status: DC

## 2022-03-06 MED ORDER — ONDANSETRON HCL 4 MG/2ML IJ SOLN: 4.0000 mg | Freq: Four times a day (QID) | INTRAMUSCULAR | Status: DC | PRN

## 2022-03-06 MED ORDER — LEVETIRACETAM IN NACL 500 MG/100ML IV SOLN
500.0000 mg | Freq: Two times a day (BID) | INTRAVENOUS | Status: DC
  Administered 2022-03-06 – 2022-03-08 (×4): 500 mg via INTRAVENOUS
  Filled 2022-03-06 (×7): qty 100

## 2022-03-06 MED ORDER — HYDRALAZINE HCL 20 MG/ML IJ SOLN
5.0000 mg | INTRAMUSCULAR | Status: DC | PRN
  Administered 2022-03-06: 5 mg via INTRAVENOUS
  Filled 2022-03-06: qty 1

## 2022-03-06 MED ORDER — SODIUM CHLORIDE 0.9 % IV SOLN
75.0000 mL/h | INTRAVENOUS | Status: DC
  Administered 2022-03-06 – 2022-03-07 (×2): 75 mL/h via INTRAVENOUS

## 2022-03-06 MED ORDER — ONDANSETRON HCL 4 MG PO TABS: 4.0000 mg | ORAL_TABLET | Freq: Four times a day (QID) | ORAL | Status: DC | PRN

## 2022-03-06 MED ORDER — DEXAMETHASONE SODIUM PHOSPHATE 10 MG/ML IJ SOLN
10.0000 mg | Freq: Once | INTRAMUSCULAR | Status: AC
  Administered 2022-03-06: 10 mg via INTRAVENOUS
  Filled 2022-03-06: qty 1

## 2022-03-06 MED ORDER — MORPHINE SULFATE (PF) 2 MG/ML IV SOLN
2.0000 mg | INTRAVENOUS | Status: DC | PRN
  Administered 2022-03-06: 2 mg via INTRAVENOUS
  Filled 2022-03-06: qty 1

## 2022-03-06 MED ORDER — LOSARTAN POTASSIUM 50 MG PO TABS
50.0000 mg | ORAL_TABLET | Freq: Every day | ORAL | Status: DC
  Administered 2022-03-06 – 2022-03-08 (×2): 50 mg via ORAL
  Filled 2022-03-06 (×2): qty 1

## 2022-03-06 MED ORDER — ACETAMINOPHEN 650 MG RE SUPP: 650.0000 mg | RECTAL | Status: DC | PRN

## 2022-03-06 MED ORDER — LEVETIRACETAM IN NACL 1000 MG/100ML IV SOLN
1000.0000 mg | Freq: Once | INTRAVENOUS | Status: AC
  Administered 2022-03-06: 1000 mg via INTRAVENOUS
  Filled 2022-03-06: qty 100

## 2022-03-06 MED ORDER — IOHEXOL 350 MG/ML SOLN
75.0000 mL | Freq: Once | INTRAVENOUS | Status: AC | PRN
  Administered 2022-03-06: 75 mL via INTRAVENOUS

## 2022-03-06 MED ORDER — ORAL CARE MOUTH RINSE: 15.0000 mL | OROMUCOSAL | Status: DC | PRN

## 2022-03-06 MED ORDER — DEXAMETHASONE SODIUM PHOSPHATE 4 MG/ML IJ SOLN
4.0000 mg | Freq: Four times a day (QID) | INTRAMUSCULAR | Status: DC
  Administered 2022-03-06 – 2022-03-08 (×9): 4 mg via INTRAVENOUS
  Filled 2022-03-06 (×9): qty 1

## 2022-03-06 NOTE — Assessment & Plan Note (Signed)
No acute issues.

## 2022-03-06 NOTE — ED Notes (Signed)
Dr. Quentin Cornwall is now at bedside consulting with family.

## 2022-03-06 NOTE — Assessment & Plan Note (Signed)
Patient is diet controlled Sliding scale insulin coverage

## 2022-03-06 NOTE — ED Provider Notes (Addendum)
[facility] Provider Note    Event Date/Time   First MD Initiated Contact with Patient 03/06/22 1609     (approximate)   History   Altered Mental Status and Arm Pain   HPI  Denise Moore is a 77 y.o. female previously quite active with a history of ulcerative colitis hypercholesterolemia GERD presents to the ER for evaluation of several weeks of progressively worsening confusion and weakness.  Family had noticed some significantly change in behavior patient having trouble following commands and following long conversation.  Follow-up with PCP today and was directed to the ER due to significant altered mental status.  No reported falls.  She is on any blood thinners.     Physical Exam   Triage Vital Signs: ED Triage Vitals  Enc Vitals Group     BP 03/06/22 1457 (!) 192/83     Pulse Rate 03/06/22 1457 88     Resp 03/06/22 1457 18     Temp 03/06/22 1500 98.5 F (36.9 C)     Temp Source 03/06/22 1500 Oral     SpO2 03/06/22 1457 97 %     Weight 03/06/22 1457 139 lb 15.9 oz (63.5 kg)     Height 03/06/22 1457 5\' 4"  (1.626 m)     Head Circumference --      Peak Flow --      Pain Score 03/06/22 1457 0     Pain Loc --      Pain Edu? --      Excl. in [place]? --     Most recent vital signs: Vitals:   03/06/22 1457 03/06/22 1500  BP: (!) 192/83   Pulse: 88   Resp: 18   Temp:  98.5 F (36.9 C)  SpO2: 97%      Constitutional: Alert but encephalopathic and disoriented x 2. Eyes: Conjunctivae are normal.  Head: Atraumatic. Nose: No congestion/rhinnorhea. Mouth/Throat: Mucous membranes are moist.   Neck: Painless ROM.  Cardiovascular:   Good peripheral circulation. Respiratory: Normal respiratory effort.  No retractions.  Gastrointestinal: Soft and nontender.  Musculoskeletal:  no deformity Neurologic:  MAE spontaneously.  Some mild weakness noted on the right upper extremity.  Sensation intact bilaterally. Skin:  Skin is warm, dry and intact. No  rash noted.     ED Results / Procedures / Treatments   Labs (all labs ordered are listed, but only abnormal results are displayed) Labs Reviewed  COMPREHENSIVE METABOLIC PANEL - Abnormal; Notable for the following components:      Result Value   Sodium 134 (*)    Glucose, Bld 108 (*)    BUN 27 (*)    Total Protein 8.4 (*)    All other components within normal limits  TROPONIN I (HIGH SENSITIVITY) - Abnormal; Notable for the following components:   Troponin I (High Sensitivity) 20 (*)    All other components within normal limits  CBC  AMMONIA  CBG MONITORING, ED  TROPONIN I (HIGH SENSITIVITY)     EKG  ED ECG REPORT I, Merlyn Lot, the attending physician, personally viewed and interpreted this ECG.   Date: 03/06/2022  EKG Time: 14:58  Rate: 80  Rhythm: sinus  Axis: normal  Intervals: normal  ST&T Change: no stemi    RADIOLOGY Please see ED Course for my review and interpretation.  I personally reviewed all radiographic images ordered to evaluate for the above acute complaints and reviewed radiology reports and findings.  These findings were personally discussed with the  patient.  Please see medical record for radiology report.    PROCEDURES:  Critical Care performed: No  .Critical Care  Performed by: Merlyn Lot, MD Authorized by: Merlyn Lot, MD   Critical care provider statement:    Critical care time (minutes):  40   Critical care was necessary to treat or prevent imminent or life-threatening deterioration of the following conditions:  CNS failure or compromise   Critical care was time spent personally by me on the following activities:  Ordering and performing treatments and interventions, ordering and review of laboratory studies, ordering and review of radiographic studies, pulse oximetry, re-evaluation of patient's condition, review of old charts, obtaining history from patient or surrogate, examination of patient, evaluation of  patient's response to treatment, discussions with primary provider, discussions with consultants and development of treatment plan with patient or surrogate    MEDICATIONS ORDERED IN ED: Medications  morphine (PF) 2 MG/ML injection 2 mg (2 mg Intravenous Given 03/06/22 1801)  dexamethasone (DECADRON) injection 10 mg (10 mg Intravenous Given 03/06/22 1801)  levETIRAcetam (KEPPRA) IVPB 1000 mg/100 mL premix (0 mg Intravenous Stopped 03/06/22 1857)  ondansetron (ZOFRAN) injection 4 mg (4 mg Intravenous Given 03/06/22 1801)  iohexol (OMNIPAQUE) 350 MG/ML injection 75 mL (75 mLs Intravenous Contrast Given 03/06/22 1823)     IMPRESSION / MDM / [place] / ED COURSE  I reviewed the triage vital signs and the nursing notes.                              Differential diagnosis includes, but is not limited to, Dehydration, sepsis, pna, uti, hypoglycemia, cva, drug effect, withdrawal, encephalitis  Patient presenting to the ER for evaluation of symptoms as described above.  Based on symptoms, risk factors and considered above differential, this presenting complaint could reflect a potentially life-threatening illness therefore the patient will be placed on continuous pulse oximetry and telemetry for monitoring.  Laboratory evaluation will be sent to evaluate for the above complaints.  CT imaging ordered out of triage on my review and interpretation shows multiple findings concerning for mets.   Clinical Course as of 03/06/22 1858  Thu Mar 06, 2022  [id number] Imaging of chest abdomen pelvis with findings concerning for primary lung cancer.  Patient has been evaluated by neurosurgery at bedside.  Not a surgical candidate.  Unfortunately very poor prognosis.  Patient will need medic Olk consultation.  She is currently comfortable.  Family updated.  Have consulted hospitalist for admission. [PR]    Clinical Course User Index [PR] Merlyn Lot, MD     FINAL CLINICAL IMPRESSION(S) / ED DIAGNOSES    Final diagnoses:  Primary malignant neoplasm of lung with metastasis to brain [facility])  Altered mental status, unspecified altered mental status type     Rx / DC Orders   ED Discharge Orders     None        Note:  This document was prepared using Dragon voice recognition software and may include unintentional dictation errors.      Merlyn Lot, MD 03/06/22 [phone]

## 2022-03-06 NOTE — Assessment & Plan Note (Signed)
SBP in the 170s to 190s Continue home losartan with as needed IV hydralazine for additional control

## 2022-03-06 NOTE — Assessment & Plan Note (Addendum)
Acute metabolic encephalopathy Patient presents with confusion, weakness, difficulty following commands CT head showed multiple hypodense and cystic lesions with surrounded vasogenic edema CT chest abdomen and pelvis suggested lung primary with hilar metastases Seen by neurosurgery, Dr. Cari Caraway in the ED with following recommendations: - Decadron 10 mg IV x 1 then 4 mg IV every 6 - Keppra 500 mg IV twice daily - Medical oncology consult in the a.m. (might be a candidate for biopsy) Consider palliative care consult in the a.m. Aspiration falls and seizure precautions Will start with SCD for DVT prophylaxis

## 2022-03-06 NOTE — IPAL (Signed)
  Interdisciplinary Goals of Care Family Meeting   Date carried out: 03/06/2022  Location of the meeting: Bedside  Member's involved: Physician, Bedside Registered Nurse, and Family Member or next of kin, son Denise Moore And brother Denise Moore  Durable Power of Attorney or Loss adjuster, chartered: son    Discussion: We discussed goals of care for Denise Moore .   I have reviewed medical records including EPIC notes, labs and imaging, assessed the patient and then met with son and brother and patient to discuss major active diagnoses, plan of care, natural trajectory, prognosis, GOC, EOL wishes, disposition and options including Full code/DNI/DNR and the concept of comfort care if DNR is elected. Questions and concerns were addressed. They are in agreement to continue current plan of care . Election for continuation of prior status.  They would like to hear what medical oncology has to offer and may lean towards palliative care based on her prognostic outlook.  Code status:   Code Status: DNR   Disposition: Continue current acute care  Time spent for the meeting: [place], MD  03/06/2022, 10:10 PM

## 2022-03-06 NOTE — ED Triage Notes (Signed)
Pt here with AMS and right arm pain. Pt's son states pt is confused and not acting like herself. Pt was here 2 weeks ago and was treated for encephalopathy but has not gotten any better. Pt recently had covid, flu, and pneumonia. Pt not able to tell me where she is or any orientation questions.

## 2022-03-06 NOTE — Assessment & Plan Note (Deleted)
BP elevated at 177/76 Continue losartan with as needed IV hydralazine for additional control

## 2022-03-06 NOTE — Consult Note (Signed)
Consult requested by:  Dr. Quentin Cornwall  Consult requested for:  Intracranial lesions  Primary Physician:  Kirk Ruths, MD  History of Present Illness: 03/06/2022 [name] is here today with a chief complaint of abdominal status.  She is also having arm pain.  She has recently had both COVID and pneumonia for which she has been seen in the hospital.  She has had difficulty with cognition since her discharge earlier in the year.  Her son, who lives with her, reports that she has had worsening cognitive performance over the last several weeks.  She has had a change in behavior and difficulty with following commands.  She has had trouble with conversations.  They went to her primary care provider today whereupon she was redirected to the emergency department for evaluation.  She denies headache, nausea, or vomiting.  She was unable to provide a history, as her speech is abnormal currently.  Her son provides this history.  I have utilized the care everywhere function in epic to review the outside records available from external health systems.  Review of Systems:  Patient cannot report  Past Medical History: Past Medical History:  Diagnosis Date   Chronic ulcerative colitis ([place]) 01/22/2015   Last Assessment & Plan:  Post decadron treatment last month. Off now and feeling great.     GERD (gastroesophageal reflux disease)    Pancreatic insufficiency 03/07/2015   Last Assessment & Plan:  On her pancrease supplement.     Pure hypercholesterolemia 10/23/2014   Last Assessment & Plan:  On diet for now    Wears upper dentures     Past Surgical History: Past Surgical History:  Procedure Laterality Date   COLONOSCOPY  11/08/2014   COLONOSCOPY N/A 10/22/2016   Procedure: COLONOSCOPY;  Surgeon: Lin Landsman, MD;  Location: [place];  Service: Gastroenterology;  Laterality: N/A;   ESOPHAGOGASTRODUODENOSCOPY N/A 10/22/2016   Procedure:  ESOPHAGOGASTRODUODENOSCOPY (EGD);  Surgeon: Lin Landsman, MD;  Location: [place];  Service: Gastroenterology;  Laterality: N/A;   fistua repair  04/07/1956   PILONIDAL CYST EXCISION  10/21/1965    Allergies: Allergies as of 03/06/2022 - Review Complete 03/06/2022  Allergen Reaction Noted   Other Itching 09/08/2014   Penicillins Rash 03/07/2015    Medications: Current Meds  Medication Sig   celecoxib (CELEBREX) 100 MG capsule Take 100 mg by mouth 2 (two) times daily.   nitrofurantoin, macrocrystal-monohydrate, (MACROBID) 100 MG capsule Take 100 mg by mouth 2 (two) times daily.    Social History: Social History   Tobacco Use   Smoking status: Every Day    Packs/day: 0.50    Years: 50.00    Total pack years: 25.00    Types: Cigarettes   Smokeless tobacco: Never  Substance Use Topics   Alcohol use: No   Drug use: No    Family Medical History: Family History  Problem Relation Age of Onset   Cancer Mother    Diabetes Father    Heart disease Father     Physical Examination: Vitals:   03/06/22 1457 03/06/22 1500  BP: (!) 192/83   Pulse: 88   Resp: 18   Temp:  98.5 F (36.9 C)  SpO2: 97%     General: Patient is well developed, well nourished, and restless.  She is in no apparent distress.   Neck:   Supple.  Full range of motion.  Respiratory: Patient is breathing without any difficulty.   NEUROLOGICAL:  Awake, alert, oriented to person, hospital, and February.  She is unable to say the year.  Her speech is nearly fully fluent, but she does make some paraphasic errors and has hesitations.    She names 2 out of 3.  She is able to repeat.  She does follow commands.  Cranial Nerves: Pupils equal round and reactive to light.  Facial tone is slightly asymmetric though her son says this is normal for her.  Facial sensation is symmetric. Shoulder shrug is symmetric. Tongue protrusion is midline.  There is mild right pronator drift.  ROM of spine:  full.    Strength: Side Biceps Triceps Deltoid Interossei Grip Wrist Ext. Wrist Flex.  R 4+ 4+ 4+ 5 4+ 5 5  L 5 5 5 5 5 5 5    Side Iliopsoas Quads Hamstring PF DF EHL  R 4+ 4+ 5 5 5 5   L 5 5 5 5 5 5    Reflexes are 1+ and symmetric at the biceps, triceps, brachioradialis, patella and achilles.   Hoffman's is absent.   Bilateral upper and lower extremity sensation is intact to light touch.    No evidence of dysmetria noted.  Gait is untested.     Medical Decision Making  Imaging: CT Brain 03/06/22 IMPRESSION: 1. Multiple hyperdense and cystic lesions with extensive surrounding vasogenic edema in both cerebral hemispheres and left cerebellum, concerning for metastatic disease. There is 10 mm of rightward midline shift and early left uncal herniation. 2. Few lucent lesions in the left frontal and parietal bones are indeterminate.   Critical Value/emergent results were called by telephone at the time of interpretation on 03/06/2022 at 4:00 pm to provider [facility] , who verbally acknowledged these results.     Electronically Signed   By: Emmit Alexanders M.D.   On: 03/06/2022 16:09  I have personally reviewed the images and agree with the above interpretation.  Assessment and Plan: Ms. [name] is a pleasant 77 y.o. female with multiple lesions within the brain.  She has extensive cerebral edema.  These lesions are most concerning for metastatic involvement of the brain.  Given her history of smoking, lung cancer would be a primary concern though there are many potential primaries including breast, colorectal, melanoma, and others.  Given the multiple large lesions and diffuse involvement of her brain, surgical resection is not indicated.  Additionally, decompressive craniectomy is not indicated for this condition.  It is very unlikely that this represents a primary glial neoplasm.  I recommended MRI of the brain as well as CT chest and pelvis.  I recommend 10 mg dexamethasone  now and then start 4 mg every 6 hours.  She should continue Keppra 500 mg twice daily.  Based on the results of her imaging studies, she may be a candidate for biopsy and treatment.  I would recommend engagement of medical and radiation oncology as she may be a candidate for palliative whole brain radiation.  Unfortunately, this is a very serious situation.  After imaging is performed, consideration of palliative care may be another option.   I have communicated my recommendations to the requesting physician and coordinated care to facilitate these recommendations.     Lilley Hubble K. Izora Ribas MD, [facility] Neurosurgery

## 2022-03-06 NOTE — H&P (Signed)
History and Physical    Patient: Denise Moore OAC:[id number] DOB: Jan 21, 1946 DOA: 03/06/2022 DOS: the patient was seen and examined on 03/06/2022 PCP: Kirk Ruths, MD  Patient coming from: Home  Chief Complaint:  Chief Complaint  Patient presents with   Altered Mental Status   Arm Pain    HPI: Denise Moore is a 77 y.o. female with medical history significant for Crohn's, HTN, carotid artery stenosis, recently hospitalized from 1/7 to 02/11/22 with post COVID bacterial pneumonia presenting with generalized weakness, nausea and vomiting with CXR showing right middle lobe opacity, who returns to the ED for evaluation of altered mental status and progressive weakness.  Family reports ongoing weakness, confusion and difficulty following commands prompting a visit to the PCP who redirected them to the ED for evaluation.  She has had no fever, chills or chest pain.  Patient is unable to contribute to the history due to altered mental status.  ED course and data review: BP 192/83 on arrival with otherwise normal vitals.  Labs for the most part unremarkable.EKG, personally reviewed and interpreted showing NSR at 82 with no ischemic ST-T wave changes.  CT head showed multiple hypodense and cystic lesions with surrounded vasogenic edema as follows (MRI brain pending): IMPRESSION: 1. Multiple hyperdense and cystic lesions with extensive surrounding vasogenic edema in both cerebral hemispheres and left cerebellum, concerning for metastatic disease. There is 10 mm of rightward midline shift and early left uncal herniation. 2. Few lucent lesions in the left frontal and parietal bones are indeterminate.  CT chest abdomen and pelvis with contrast suggested lung primary with hilar metastases and without evidence of additional metastatic disease as outlined as follows: IMPRESSION: 1. Anterior perihilar mass of the left upper lobe measuring 2.6 x 2.2 cm, consistent with primary lung malignancy. 2.  Multiple bulky, necrotic appearing left hilar, AP window, and prevascular lymph nodes, consistent with nodal metastatic disease. 3. New heterogeneously hypodense bilateral adrenal masses, consistent with adrenal metastatic disease. 4. No other evidence of metastatic disease in the chest, abdomen, or pelvis. 5. Emphysema. 6. Coronary artery disease. 7. Cholelithiasis. 8. Bilateral nonobstructive nephrolithiasis.  Neurosurgeon, Dr. Cari Caraway was consulted from the emergency room who opined that patient was not a surgical candidate but recommended dexamethasone, Keppra, engagement of medical and radiation oncology for consideration of palliative whole brain radiation. Hospitalist consulted for admission for lung cancer with brain metastasis presented with altered mental status.    Past Medical History:  Diagnosis Date   Chronic ulcerative colitis ([place]) 01/22/2015   Last Assessment & Plan:  Post decadron treatment last month. Off now and feeling great.     GERD (gastroesophageal reflux disease)    Pancreatic insufficiency 03/07/2015   Last Assessment & Plan:  On her pancrease supplement.     Pure hypercholesterolemia 10/23/2014   Last Assessment & Plan:  On diet for now    Wears upper dentures    Past Surgical History:  Procedure Laterality Date   COLONOSCOPY  11/08/2014   COLONOSCOPY N/A 10/22/2016   Procedure: COLONOSCOPY;  Surgeon: Lin Landsman, MD;  Location: [place];  Service: Gastroenterology;  Laterality: N/A;   ESOPHAGOGASTRODUODENOSCOPY N/A 10/22/2016   Procedure: ESOPHAGOGASTRODUODENOSCOPY (EGD);  Surgeon: Lin Landsman, MD;  Location: [place];  Service: Gastroenterology;  Laterality: N/A;   fistua repair  04/07/1956   PILONIDAL CYST EXCISION  10/21/1965   Social History:  reports that she has been smoking cigarettes. She has a 25.00 pack-year smoking history. She has never used  smokeless tobacco. She reports that she does not drink alcohol and  does not use drugs.  Allergies  Allergen Reactions   Other Itching    Bandaids  Bandaids    Penicillins Rash    swelling    Family History  Problem Relation Age of Onset   Cancer Mother    Diabetes Father    Heart disease Father     Prior to Admission medications   Medication Sig Start Date End Date Taking? Authorizing Provider  celecoxib (CELEBREX) 100 MG capsule Take 100 mg by mouth 2 (two) times daily. 03/03/22 04/02/22 Yes [provider]  nitrofurantoin, macrocrystal-monohydrate, (MACROBID) 100 MG capsule Take 100 mg by mouth 2 (two) times daily. 03/03/22 03/10/22 Yes [provider]  Cetirizine HCl 10 MG CAPS Take by mouth. Patient not taking: Reported on 02/09/2022 12/25/14   [provider]  Cholecalciferol 25 MCG (1000 UT) tablet Take by mouth. Patient not taking: Reported on 02/09/2022    [provider]  gabapentin (NEURONTIN) 100 MG capsule Take 200 mg by mouth 2 (two) times daily. Patient not taking: Reported on 03/06/2022    [provider]  losartan (COZAAR) 50 MG tablet Take 50 mg by mouth daily. Patient not taking: Reported on 03/06/2022    [provider]  potassium chloride (KLOR-CON) 20 MEQ packet Take 40 mEq by mouth daily for 2 days. 02/11/22 02/13/22  Amin, Jeanella Flattery, MD  rosuvastatin (CRESTOR) 20 MG tablet Take by mouth. 10/15/20 02/09/22  [provider]  Turmeric 1053 MG TABS Take by mouth. Patient not taking: Reported on 04/30/2021    [provider]    Physical Exam: Vitals:   03/06/22 1457 03/06/22 1500 03/06/22 1900  BP: (!) 192/83  (!) 177/76  Pulse: 88  66  Resp: 18    Temp:  98.5 F (36.9 C)   TempSrc:  Oral   SpO2: 97%  96%  Weight: 63.5 kg    Height: 5\' 4"  (1.626 m)     Physical Exam Vitals and nursing note reviewed.  Constitutional:      General: She is awake. She is not in acute distress.    Comments: Denise Moore but very confused.  Not answering appropriately to  questions.  Answers are unrelated to the questions asked.  HENT:     Head: Normocephalic and atraumatic.  Cardiovascular:     Rate and Rhythm: Normal rate and regular rhythm.     Heart sounds: Normal heart sounds.  Pulmonary:     Effort: Pulmonary effort is normal.     Breath sounds: Normal breath sounds.  Abdominal:     Palpations: Abdomen is soft.     Tenderness: There is no abdominal tenderness.  Neurological:     General: No focal deficit present.     Mental Status: She is alert.     Labs on Admission: I have personally reviewed following labs and imaging studies  CBC: Recent Labs  Lab 03/06/22 1500  WBC 9.3  HGB 14.5  HCT 43.2  MCV 89.1  PLT 175   Basic Metabolic Panel: Recent Labs  Lab 03/06/22 1500  NA 134*  K 3.6  CL 98  CO2 22  GLUCOSE 108*  BUN 27*  CREATININE 0.75  CALCIUM 9.4   GFR: Estimated Creatinine Clearance: 51.7 mL/min (by C-G formula based on SCr of 0.75 mg/dL). Liver Function Tests: Recent Labs  Lab 03/06/22 1500  AST 20  ALT 14  ALKPHOS 68  BILITOT 0.9  PROT 8.4*  ALBUMIN 4.3   No results for input(s): "LIPASE", "AMYLASE" in the last 168 hours. Recent Labs  Lab 03/06/22 1500  AMMONIA 17   Coagulation Profile: No results for input(s): "INR", "PROTIME" in the last 168 hours. Cardiac Enzymes: No results for input(s): "CKTOTAL", "CKMB", "CKMBINDEX", "TROPONINI" in the last 168 hours. BNP (last 3 results) No results for input(s): "PROBNP" in the last 8760 hours. HbA1C: No results for input(s): "HGBA1C" in the last 72 hours. CBG: No results for input(s): "GLUCAP" in the last 168 hours. Lipid Profile: No results for input(s): "CHOL", "HDL", "LDLCALC", "TRIG", "CHOLHDL", "LDLDIRECT" in the last 72 hours. Thyroid Function Tests: No results for input(s): "TSH", "T4TOTAL", "FREET4", "T3FREE", "THYROIDAB" in the last 72 hours. Anemia Panel: No results for input(s): "VITAMINB12", "FOLATE", "FERRITIN", "TIBC", "IRON", "RETICCTPCT"  in the last 72 hours. Urine analysis:    Component Value Date/Time   COLORURINE STRAW (A) 02/09/2022 2009   APPEARANCEUR CLEAR (A) 02/09/2022 2009   LABSPEC 1.004 (L) 02/09/2022 2009   PHURINE 7.0 02/09/2022 2009   GLUCOSEU NEGATIVE 02/09/2022 2009   HGBUR NEGATIVE 02/09/2022 2009   BILIRUBINUR NEGATIVE 02/09/2022 2009   [place] NEGATIVE 02/09/2022 2009   PROTEINUR NEGATIVE 02/09/2022 2009   NITRITE NEGATIVE 02/09/2022 2009   LEUKOCYTESUR NEGATIVE 02/09/2022 2009    Radiological Exams on Admission: CT CHEST ABDOMEN PELVIS W CONTRAST  Result Date: 03/06/2022 CLINICAL DATA:  Brain metastases, unknown primary * Tracking Code: BO * EXAM: CT CHEST, ABDOMEN, AND PELVIS WITH CONTRAST TECHNIQUE: Multidetector CT imaging of the chest, abdomen and pelvis was performed following the standard protocol during bolus administration of intravenous contrast. RADIATION DOSE REDUCTION: This exam was performed according to the departmental dose-optimization program which includes automated exposure control, adjustment of the mA and/or kV according to patient size and/or use of iterative reconstruction technique. CONTRAST:  54mL OMNIPAQUE IOHEXOL 350 MG/ML SOLN COMPARISON:  CT abdomen, 10/14/2016 FINDINGS: CT CHEST FINDINGS Cardiovascular: Aortic atherosclerosis. Normal heart size. Three-vessel coronary artery calcifications. No pericardial effusion. Mediastinum/Nodes: Multiple bulky, necrotic appearing left hilar, AP window, and prevascular lymph nodes measuring up to 3.3 x 3.3 cm (series 2, image 20). Thyroid gland, trachea, and esophagus demonstrate no significant findings. Lungs/Pleura: Moderate centrilobular emphysema. Anterior perihilar mass of the left upper lobe measuring 2.6 x 2.2 cm (series 2, image 24). No pleural effusion or pneumothorax. Musculoskeletal: No chest wall abnormality. No acute osseous findings. CT ABDOMEN PELVIS FINDINGS Hepatobiliary: No solid liver abnormality is seen. Tiny gallstones. No  gallbladder wall thickening, or biliary dilatation. Pancreas: Unremarkable. No pancreatic ductal dilatation or surrounding inflammatory changes. Spleen: Normal in size without significant abnormality. Adrenals/Urinary Tract: New heterogeneously hypodense bilateral adrenal masses, larger on the right measuring 3.7 x 2.6 cm (series 2, image 55). Multiple simple, benign bilateral renal cortical cysts, for which no further follow-up or characterization is required. Multiple small bilateral nonobstructive renal calculi. Multifocal bilateral renal cortical scarring in keeping with prior infectious or obstructive insult. No ureteral calculi or hydronephrosis. Bladder is normal. Stomach/Bowel: Stomach is within normal limits. Appendix appears normal. No evidence of bowel wall thickening, distention, or inflammatory changes. Sigmoid diverticula. Vascular/Lymphatic: No significant vascular findings are present. No enlarged abdominal or pelvic lymph nodes. Reproductive: No mass or other abnormality. Other: No abdominal wall hernia or abnormality. No ascites. Musculoskeletal: No acute osseous findings. Unchanged benign fat containing vertebral body hemangioma of L5 (series 6, image 62). IMPRESSION: 1. Anterior perihilar mass of the left upper lobe measuring 2.6 x 2.2 cm, consistent with primary lung malignancy.  2. Multiple bulky, necrotic appearing left hilar, AP window, and prevascular lymph nodes, consistent with nodal metastatic disease. 3. New heterogeneously hypodense bilateral adrenal masses, consistent with adrenal metastatic disease. 4. No other evidence of metastatic disease in the chest, abdomen, or pelvis. 5. Emphysema. 6. Coronary artery disease. 7. Cholelithiasis. 8. Bilateral nonobstructive nephrolithiasis. Aortic Atherosclerosis (ICD10-I70.0) and Emphysema (ICD10-J43.9). Electronically Signed   By: Delanna Ahmadi M.D.   On: 03/06/2022 18:50   CT HEAD WO CONTRAST (5MM)  Result Date: 03/06/2022 CLINICAL DATA:   Mental status change, unknown cause. Altered mental status and right arm pain. EXAM: CT HEAD WITHOUT CONTRAST TECHNIQUE: Contiguous axial images were obtained from the base of the skull through the vertex without intravenous contrast. RADIATION DOSE REDUCTION: This exam was performed according to the departmental dose-optimization program which includes automated exposure control, adjustment of the mA and/or kV according to patient size and/or use of iterative reconstruction technique. COMPARISON:  MRI brain 04/05/2021. FINDINGS: Brain: Multiple hyperdense and cystic lesions with extensive surrounding vasogenic edema in both cerebral hemispheres in the left cerebellum. The dominant cystic lesion in the left parietal lobe measures up to 3.8 x 2.9 cm (image 18 series 2). A left subinsular lesion measures up to 2.6 x 2.4 cm (image 11 series 2). A lesion in the left caudate head measures up to 1.7 x 1.5 cm (image 13 series 2). There is 10 mm of associated rightward midline shift and early left uncal herniation. No hydrocephalus. No extra-axial collection. Vascular: No hyperdense vessel or unexpected calcification. Skull: Few lucent lesions in the left frontal and parietal bones are indeterminate. Sinuses/Orbits: Paranasal sinuses, mastoid air cells, and middle ear cavities are well aerated. Orbits are unremarkable. Other: None. IMPRESSION: 1. Multiple hyperdense and cystic lesions with extensive surrounding vasogenic edema in both cerebral hemispheres and left cerebellum, concerning for metastatic disease. There is 10 mm of rightward midline shift and early left uncal herniation. 2. Few lucent lesions in the left frontal and parietal bones are indeterminate. Critical Value/emergent results were called by telephone at the time of interpretation on 03/06/2022 at 4:00 pm to provider [facility] , who verbally acknowledged these results. Electronically Signed   By: Emmit Alexanders M.D.   On: 03/06/2022 16:09     Data  Reviewed: Relevant notes from primary care and specialist visits, past discharge summaries as available in EHR, including Care Everywhere. 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 Cari Caraway in the ED with following recommendations: - Decadron 10 mg IV x 1 then 4 mg IV every 6 - Keppra 500 mg IV twice daily - Medical oncology consult in the a.m. (might be a candidate for biopsy) Consider palliative care consult in the a.m. Aspiration falls and seizure precautions Will start with SCD for DVT prophylaxis  Hypertensive urgency SBP in the 170s to 190s Continue home losartan with as needed IV hydralazine for additional control  Diabetes mellitus, type II ([place]) Patient is diet controlled Sliding scale insulin coverage  Crohn's disease of both small and large intestine (HCC) No acute issues   DVT prophylaxis: SCD  Consults: none  Advance Care Planning:   Code Status: Prior   Family Communication: Brother and son at the bedside  Disposition Plan: Back to previous home environment  Severity of Illness: The appropriate patient status for this patient is INPATIENT. Inpatient status is judged to be reasonable and necessary in order to provide the required intensity of service to ensure the patient's safety. The patient's presenting symptoms, physical  exam findings, and initial radiographic and laboratory data in the context of their chronic comorbidities is felt to place them at high risk for further clinical deterioration. Furthermore, it is not anticipated that the patient will be medically stable for discharge from the hospital within 2 midnights of admission.   * I certify that at the point of admission it is my clinical judgment that the patient will require inpatient hospital care spanning beyond 2 midnights from the point of admission due to high intensity of service, high risk for further deterioration and high frequency of surveillance required.*  Author: Athena Masse, MD 03/06/2022 7:39 PM  For on call review [email or web address].

## 2022-03-07 ENCOUNTER — Encounter: Payer: Self-pay | Admitting: Internal Medicine

## 2022-03-07 DIAGNOSIS — R918 Other nonspecific abnormal finding of lung field: Secondary | ICD-10-CM

## 2022-03-07 DIAGNOSIS — C3490 Malignant neoplasm of unspecified part of unspecified bronchus or lung: Secondary | ICD-10-CM | POA: Diagnosis not present

## 2022-03-07 DIAGNOSIS — G9341 Metabolic encephalopathy: Secondary | ICD-10-CM | POA: Diagnosis not present

## 2022-03-07 DIAGNOSIS — R590 Localized enlarged lymph nodes: Secondary | ICD-10-CM

## 2022-03-07 DIAGNOSIS — Z515 Encounter for palliative care: Secondary | ICD-10-CM | POA: Diagnosis not present

## 2022-03-07 DIAGNOSIS — Z7189 Other specified counseling: Secondary | ICD-10-CM

## 2022-03-07 DIAGNOSIS — Z66 Do not resuscitate: Secondary | ICD-10-CM

## 2022-03-07 DIAGNOSIS — C7931 Secondary malignant neoplasm of brain: Secondary | ICD-10-CM | POA: Diagnosis not present

## 2022-03-07 NOTE — ED Notes (Signed)
RN at bedside. Pt received full liquid diet, son at bedside trying to assist pt to eat. Pt altered and A&Ox0. Pt repeating "oh to hell with it." RN asked patient if she has to urinate, pt replied "oh I dont know". RN did bladder scan patient has 552ml. Pt linens and underwear completely dry. RN advised pt and son we will have to probably do a straight catheter. Pt says "this is a bunch of bullshit, ima just die". RN notified MD Hollice Gong.

## 2022-03-07 NOTE — ED Notes (Signed)
Pt checked for urine incontinence, pt has not urinated. Pt placed on purewick.

## 2022-03-07 NOTE — ED Notes (Signed)
Per MD Hollice Gong, place indwelling urinary catheter for patient urine retention.

## 2022-03-07 NOTE — Consult Note (Signed)
PULMONOLOGY         Date: 03/07/2022,   MRN# [id number] Denise Moore 02-Dec-1945     AdmissionWeight: 63.5 kg                 CurrentWeight: 63.5 kg  Referring provider: Dr Leontine Locket   CHIEF COMPLAINT:   Lung mass with brain mets   HISTORY OF PRESENT ILLNESS   77 yo with PMH of [place], htn, CAD, s/p COVID19 in Jan 2024 came in for progressive dyspnea.  CT chest with hilar and mediastinal LAD with LUL mass and background of centrilobular emphysema.  There is also nephrolithiais, adrenal masses and cholelithiasis.  She has CT head with ring enhancing lesions consistent with mets.    Patient is altered with expressive aphasia suspect due to brain mets.  She is not interested in much.  Difficult to have interview and comprehensive evaluation.    PCCM for additional evaluation and management.    PAST MEDICAL HISTORY   Past Medical History:  Diagnosis Date   Chronic ulcerative colitis ([place]) 01/22/2015   Last Assessment & Plan:  Post decadron treatment last month. Off now and feeling great.     GERD (gastroesophageal reflux disease)    Pancreatic insufficiency 03/07/2015   Last Assessment & Plan:  On her pancrease supplement.     Pure hypercholesterolemia 10/23/2014   Last Assessment & Plan:  On diet for now    Wears upper dentures      SURGICAL HISTORY   Past Surgical History:  Procedure Laterality Date   COLONOSCOPY  11/08/2014   COLONOSCOPY N/A 10/22/2016   Procedure: COLONOSCOPY;  Surgeon: Lin Landsman, MD;  Location: [facility];  Service: Gastroenterology;  Laterality: N/A;   ESOPHAGOGASTRODUODENOSCOPY N/A 10/22/2016   Procedure: ESOPHAGOGASTRODUODENOSCOPY (EGD);  Surgeon: Lin Landsman, MD;  Location: [facility];  Service: Gastroenterology;  Laterality: N/A;   fistua repair  04/07/1956   PILONIDAL CYST EXCISION  10/21/1965     FAMILY HISTORY   Family History  Problem Relation Age of Onset   Cancer Mother    Diabetes Father     Heart disease Father      SOCIAL HISTORY   Social History   Tobacco Use   Smoking status: Every Day    Packs/day: 0.50    Years: 50.00    Total pack years: 25.00    Types: Cigarettes   Smokeless tobacco: Never  Substance Use Topics   Alcohol use: No   Drug use: No     MEDICATIONS    Home Medication:  Current Outpatient Rx   Order #: [id number] Class: Historical Med   Order #: [id number] Class: Historical Med   Order #: [id number] Class: Historical Med   Order #: [id number] Class: Historical Med   Order #: [id number] Class: Historical Med   Order #: [id number] Class: Historical Med   Order #: [id number] Class: Normal   Order #: [id number] Class: Historical Med   Order #: [id number] Class: Historical Med    Current Medication:  Current Facility-Administered Medications:    0.9 %  sodium chloride infusion, 75 mL/hr, Intravenous, Continuous, Judd Gaudier V, MD, Last Rate: 75 mL/hr at 03/07/22 0814, 75 mL/hr at 03/07/22 0814   acetaminophen (TYLENOL) tablet 650 mg, 650 mg, Oral, Q4H PRN **OR** acetaminophen (TYLENOL) suppository 650 mg, 650 mg, Rectal, Q4H PRN, Athena Masse, MD   dexamethasone (DECADRON) injection 4 mg, 4 mg, Intravenous, Q6H, Athena Masse, MD, 4 mg at 03/07/22 0534   hydrALAZINE (APRESOLINE) injection 5 mg,  5 mg, Intravenous, Q4H PRN, Athena Masse, MD, 5 mg at 03/06/22 2032   levETIRAcetam (KEPPRA) IVPB 500 mg/100 mL premix, 500 mg, Intravenous, Q12H, Athena Masse, MD, Stopped at 03/07/22 0854   losartan (COZAAR) tablet 50 mg, 50 mg, Oral, Daily, Athena Masse, MD, 50 mg at 03/06/22 2032   morphine (PF) 2 MG/ML injection 2 mg, 2 mg, Intravenous, Q3H PRN, Merlyn Lot, MD, 2 mg at 03/06/22 1801   ondansetron (ZOFRAN) tablet 4 mg, 4 mg, Oral, Q6H PRN **OR** ondansetron (ZOFRAN) injection 4 mg, 4 mg, Intravenous, Q6H PRN, Athena Masse, MD  Current Outpatient Medications:    celecoxib (CELEBREX) 100 MG capsule, Take 100 mg by mouth 2 (two) times  daily., Disp: , Rfl:    nitrofurantoin, macrocrystal-monohydrate, (MACROBID) 100 MG capsule, Take 100 mg by mouth 2 (two) times daily., Disp: , Rfl:    Cetirizine HCl 10 MG CAPS, Take by mouth. (Patient not taking: Reported on 02/09/2022), Disp: , Rfl:    Cholecalciferol 25 MCG (1000 UT) tablet, Take by mouth. (Patient not taking: Reported on 02/09/2022), Disp: , Rfl:    gabapentin (NEURONTIN) 100 MG capsule, Take 200 mg by mouth 2 (two) times daily. (Patient not taking: Reported on 03/06/2022), Disp: , Rfl:    losartan (COZAAR) 50 MG tablet, Take 50 mg by mouth daily. (Patient not taking: Reported on 03/06/2022), Disp: , Rfl:    potassium chloride (KLOR-CON) 20 MEQ packet, Take 40 mEq by mouth daily for 2 days., Disp: 4 packet, Rfl: 0   rosuvastatin (CRESTOR) 20 MG tablet, Take by mouth., Disp: , Rfl:    Turmeric 1053 MG TABS, Take by mouth. (Patient not taking: Reported on 04/30/2021), Disp: , Rfl:     ALLERGIES   Other and Penicillins     REVIEW OF SYSTEMS    Review of Systems:  Gen:  Denies  fever, sweats, chills weigh loss  HEENT: Denies blurred vision, double vision, ear pain, eye pain, hearing loss, nose bleeds, sore throat Cardiac:  No dizziness, chest pain or heaviness, chest tightness,edema Resp:   reports dyspnea chronically  Gi: Denies swallowing difficulty, stomach pain, nausea or vomiting, diarrhea, constipation, bowel incontinence Gu:  Denies bladder incontinence, burning urine Ext:   Denies Joint pain, stiffness or swelling Skin: Denies  skin rash, easy bruising or bleeding or hives Endoc:  Denies polyuria, polydipsia , polyphagia or weight change Psych:   Denies depression, insomnia or hallucinations   Other:  All other systems negative   VS: BP (!) 162/59   Pulse 62   Temp 98.1 F (36.7 C)   Resp 17   Ht 5\' 4"  (1.626 m)   Wt 63.5 kg   SpO2 98%   BMI 24.03 kg/m      PHYSICAL EXAM    GENERAL:NAD, no fevers, chills, no weakness no fatigue HEAD:  Normocephalic, atraumatic.  EYES: Pupils equal, round, reactive to light. Extraocular muscles intact. No scleral icterus.  MOUTH: Moist mucosal membrane. Dentition intact. No abscess noted.  EAR, NOSE, THROAT: Clear without exudates. No external lesions.  NECK: Supple. No thyromegaly. No nodules. No JVD.  PULMONARY: decreased breath sounds with mild rhonchi worse at bases bilaterally.  CARDIOVASCULAR: S1 and S2. Regular rate and rhythm. No murmurs, rubs, or gallops. No edema. Pedal pulses 2+ bilaterally.  GASTROINTESTINAL: Soft, nontender, nondistended. No masses. Positive bowel sounds. No hepatosplenomegaly.  MUSCULOSKELETAL: No swelling, clubbing, or edema. Range of motion full in all extremities.  NEUROLOGIC: Cranial nerves II through XII  are intact. No gross focal neurological deficits. Sensation intact. Reflexes intact.  SKIN: No ulceration, lesions, rashes, or cyanosis. Skin warm and dry. Turgor intact.  PSYCHIATRIC: Mood, affect within normal limits. The patient is awake, alert and oriented x 3. Insight, judgment intact.       IMAGING     ASSESSMENT/PLAN   Left lung mass with hilar/mediastinal lymphadenopathy and distant metastasis.     -patient with brain mets and encephalopathy.  Unable to tell me much but state she does not want anything.     - I agree with palliative and hospice if patient has capacity and truly does not want therapy   - she appears frail and chronically ill.      - noted code status DNR/DNI    - continue [place] discussion with family and I would recommend hospice for this patient            Thank you for allowing me to participate in the care of this patient.   Patient/Family are satisfied with care plan and all questions have been answered.    Provider disclosure: Patient with at least one acute or chronic illness or injury that poses a threat to life or bodily function and is being managed actively during this encounter.  All of the below  services have been performed independently by signing provider:  review of prior documentation from internal and or external health records.  Review of previous and current lab results.  Interview and comprehensive assessment during patient visit today. Review of current and previous chest radiographs/CT scans. Discussion of management and test interpretation with health care team and patient/family.   This document was prepared using Dragon voice recognition software and may include unintentional dictation errors.     Ottie Glazier, M.D.  Division of Pulmonary & Critical Care Medicine

## 2022-03-07 NOTE — ED Notes (Signed)
Assumed care from [facility]. Pt resting comfortably in bed at this time. Pt denies any current needs or questions. Call light with in reach. Family at bedside.

## 2022-03-07 NOTE — Progress Notes (Signed)
PROGRESS NOTE  Denise Moore YYT:[id number] DOB: Dec 20, 1945 DOA: 03/06/2022 PCP: Kirk Ruths, MD  Hospital Course/Subjective: Denise Moore is a 77 y.o. female with medical history significant for Crohn's, HTN, carotid artery stenosis, recently hospitalized from 1/7 to 02/11/22 with post COVID bacterial pneumonia presenting with generalized weakness, nausea and vomiting with CXR showing right middle lobe opacity, who returns to the ED for evaluation of altered mental status and progressive weakness. Unfortunately, workup in the ER shows suspected lung malignancy with brain metastases.   This morning she is resting comfortably, somewhat confused with word finding difficulty.  Assessment and Plan: Lung cancer metastatic to brain, new diagnosis by CT 03/06/2022 [facility]) Acute metabolic encephalopathy Patient presents with confusion, weakness, difficulty following commands CT head showed multiple hypodense and cystic lesions with surrounded vasogenic edema CT chest abdomen and pelvis suggested lung primary with hilar metastases Seen by neurosurgery, Dr. Cari Caraway in the ED with following recommendations: - Decadron 10 mg IV x 1 then 4 mg IV every 6 - Keppra 500 mg IV twice daily I have Consulted medical oncology Dr. Tasia Catchings and Pulmonary Dr. Lanney Gins today Have also requested Palliative care consult. Will keep NPO until seen by Pulm today. Aspiration falls and seizure precautions Will start with SCD for DVT prophylaxis  Hypertensive urgency SBP in the 170s to 190s Continue home losartan with as needed IV hydralazine for additional control  Diabetes mellitus, type II ([place]) Patient is diet controlled Sliding scale insulin coverage  Crohn's disease of both small and large intestine (HCC) No acute issues  DVT Prophylaxis: SCDs, avoid pharmacologic ppx in case of Bx.  Code Status: DNR, per family meeting 2/1.   Family Communication: None present at bedside this AM.   Disposition Plan:  Pending the above.   Consultants: Pulm Onc   Procedures: None   Antimicrobials: Anti-infectives (From admission, onward)    None       Objective: Vitals:   03/07/22 0527 03/07/22 0730 03/07/22 0800 03/07/22 0830  BP: (!) 141/60 (!) 121/59 (!) 142/56 (!) 162/59  Pulse: 87 68 65 62  Resp: 18 17 13 17   Temp: 98 F (36.7 C)  98.1 F (36.7 C)   TempSrc: Oral     SpO2: 93% 90% 94% 98%  Weight:      Height:        Intake/Output Summary (Last 24 hours) at 03/07/2022 2751 Last data filed at 03/06/2022 2112 Gross per 24 hour  Intake 100 ml  Output --  Net 100 ml   Filed Weights   03/06/22 1457  Weight: 63.5 kg   Exam: General:  Alert, oriented to self, calm, in no acute distress Eyes: EOMI, clear sclerea Neck: supple, no masses, trachea mildline  Cardiovascular: RRR, no murmurs or rubs, no peripheral edema  Respiratory: clear to auscultation bilaterally, no wheezes, no crackles  Abdomen: soft, nontender, nondistended, normal bowel tones heard  Skin: dry, no rashes  Musculoskeletal: no joint effusions, normal range of motion  Psychiatric: appropriate affect, normal speech  Neurologic: extraocular muscles intact, clear speech, moving all extremities with intact sensorium, with some word finding difficulty   Data Reviewed: CBC: Recent Labs  Lab 03/06/22 1500  WBC 9.3  HGB 14.5  HCT 43.2  MCV 89.1  PLT 700   Basic Metabolic Panel: Recent Labs  Lab 03/06/22 1500  NA 134*  K 3.6  CL 98  CO2 22  GLUCOSE 108*  BUN 27*  CREATININE 0.75  CALCIUM 9.4   GFR: Estimated Creatinine  Clearance: 51.7 mL/min (by C-G formula based on SCr of 0.75 mg/dL). Liver Function Tests: Recent Labs  Lab 03/06/22 1500  AST 20  ALT 14  ALKPHOS 68  BILITOT 0.9  PROT 8.4*  ALBUMIN 4.3   No results for input(s): "LIPASE", "AMYLASE" in the last 168 hours. Recent Labs  Lab 03/06/22 1500  AMMONIA 17   Coagulation Profile: No results for input(s): "INR", "PROTIME" in the  last 168 hours. Cardiac Enzymes: No results for input(s): "CKTOTAL", "CKMB", "CKMBINDEX", "TROPONINI" in the last 168 hours. BNP (last 3 results) No results for input(s): "PROBNP" in the last 8760 hours. HbA1C: No results for input(s): "HGBA1C" in the last 72 hours. CBG: No results for input(s): "GLUCAP" in the last 168 hours. Lipid Profile: No results for input(s): "CHOL", "HDL", "LDLCALC", "TRIG", "CHOLHDL", "LDLDIRECT" in the last 72 hours. Thyroid Function Tests: No results for input(s): "TSH", "T4TOTAL", "FREET4", "T3FREE", "THYROIDAB" in the last 72 hours. Anemia Panel: No results for input(s): "VITAMINB12", "FOLATE", "FERRITIN", "TIBC", "IRON", "RETICCTPCT" in the last 72 hours. Urine analysis:    Component Value Date/Time   COLORURINE STRAW (A) 02/09/2022 2009   APPEARANCEUR CLEAR (A) 02/09/2022 2009   LABSPEC 1.004 (L) 02/09/2022 2009   PHURINE 7.0 02/09/2022 2009   GLUCOSEU NEGATIVE 02/09/2022 2009   HGBUR NEGATIVE 02/09/2022 2009   BILIRUBINUR NEGATIVE 02/09/2022 2009   [place] NEGATIVE 02/09/2022 2009   PROTEINUR NEGATIVE 02/09/2022 2009   NITRITE NEGATIVE 02/09/2022 2009   LEUKOCYTESUR NEGATIVE 02/09/2022 2009   Sepsis Labs: @LABRCNTIP (procalcitonin:4,lacticidven:4)  )No results found for this or any previous visit (from the past 240 hour(s)).   Studies: MR BRAIN WO CONTRAST  Result Date: 03/06/2022 CLINICAL DATA:  Intracranial metastatic disease EXAM: MRI HEAD WITHOUT CONTRAST TECHNIQUE: Multiplanar, multiecho pulse sequences of the brain and surrounding structures were obtained without intravenous contrast. COMPARISON:  CT 03/06/2022 Brain MRI 04/05/2021 FINDINGS: Brain: No acute infarct. There are multiple intraparenchymal metastases with mixed cystic and solid components and areas of internal petechial hemorrhage. There is a large amount of vasogenic edema surrounding multiple lesions, worst in the left temporal and parietal lobes, causing rightward midline  shift 6 mm. The largest lesion is located in the left occipital lobe and measures 3.6 cm. There are at least 9 lesions. More accurate measurement could be performed with postcontrast imaging. Vascular: Normal flow voids. Skull and upper cervical spine: Normal marrow signal. Sinuses/Orbits: Negative. Other: None. IMPRESSION: Multiple intraparenchymal metastases with mixed cystic and solid components and areas of internal petechial hemorrhage. There is a large amount of vasogenic edema surrounding multiple lesions, worst in the left temporal and parietal lobes, causing rightward midline shift of 6 mm. More accurate measurement could be performed with postcontrast imaging. Electronically Signed   By: Ulyses Jarred M.D.   On: 03/06/2022 23:03   CT CHEST ABDOMEN PELVIS W CONTRAST  Result Date: 03/06/2022 CLINICAL DATA:  Brain metastases, unknown primary * Tracking Code: BO * EXAM: CT CHEST, ABDOMEN, AND PELVIS WITH CONTRAST TECHNIQUE: Multidetector CT imaging of the chest, abdomen and pelvis was performed following the standard protocol during bolus administration of intravenous contrast. RADIATION DOSE REDUCTION: This exam was performed according to the departmental dose-optimization program which includes automated exposure control, adjustment of the mA and/or kV according to patient size and/or use of iterative reconstruction technique. CONTRAST:  73mL OMNIPAQUE IOHEXOL 350 MG/ML SOLN COMPARISON:  CT abdomen, 10/14/2016 FINDINGS: CT CHEST FINDINGS Cardiovascular: Aortic atherosclerosis. Normal heart size. Three-vessel coronary artery calcifications. No pericardial effusion. Mediastinum/Nodes: Multiple bulky,  necrotic appearing left hilar, AP window, and prevascular lymph nodes measuring up to 3.3 x 3.3 cm (series 2, image 20). Thyroid gland, trachea, and esophagus demonstrate no significant findings. Lungs/Pleura: Moderate centrilobular emphysema. Anterior perihilar mass of the left upper lobe measuring 2.6 x 2.2  cm (series 2, image 24). No pleural effusion or pneumothorax. Musculoskeletal: No chest wall abnormality. No acute osseous findings. CT ABDOMEN PELVIS FINDINGS Hepatobiliary: No solid liver abnormality is seen. Tiny gallstones. No gallbladder wall thickening, or biliary dilatation. Pancreas: Unremarkable. No pancreatic ductal dilatation or surrounding inflammatory changes. Spleen: Normal in size without significant abnormality. Adrenals/Urinary Tract: New heterogeneously hypodense bilateral adrenal masses, larger on the right measuring 3.7 x 2.6 cm (series 2, image 55). Multiple simple, benign bilateral renal cortical cysts, for which no further follow-up or characterization is required. Multiple small bilateral nonobstructive renal calculi. Multifocal bilateral renal cortical scarring in keeping with prior infectious or obstructive insult. No ureteral calculi or hydronephrosis. Bladder is normal. Stomach/Bowel: Stomach is within normal limits. Appendix appears normal. No evidence of bowel wall thickening, distention, or inflammatory changes. Sigmoid diverticula. Vascular/Lymphatic: No significant vascular findings are present. No enlarged abdominal or pelvic lymph nodes. Reproductive: No mass or other abnormality. Other: No abdominal wall hernia or abnormality. No ascites. Musculoskeletal: No acute osseous findings. Unchanged benign fat containing vertebral body hemangioma of L5 (series 6, image 62). IMPRESSION: 1. Anterior perihilar mass of the left upper lobe measuring 2.6 x 2.2 cm, consistent with primary lung malignancy. 2. Multiple bulky, necrotic appearing left hilar, AP window, and prevascular lymph nodes, consistent with nodal metastatic disease. 3. New heterogeneously hypodense bilateral adrenal masses, consistent with adrenal metastatic disease. 4. No other evidence of metastatic disease in the chest, abdomen, or pelvis. 5. Emphysema. 6. Coronary artery disease. 7. Cholelithiasis. 8. Bilateral  nonobstructive nephrolithiasis. Aortic Atherosclerosis (ICD10-I70.0) and Emphysema (ICD10-J43.9). Electronically Signed   By: Delanna Ahmadi M.D.   On: 03/06/2022 18:50   CT HEAD WO CONTRAST (5MM)  Result Date: 03/06/2022 CLINICAL DATA:  Mental status change, unknown cause. Altered mental status and right arm pain. EXAM: CT HEAD WITHOUT CONTRAST TECHNIQUE: Contiguous axial images were obtained from the base of the skull through the vertex without intravenous contrast. RADIATION DOSE REDUCTION: This exam was performed according to the departmental dose-optimization program which includes automated exposure control, adjustment of the mA and/or kV according to patient size and/or use of iterative reconstruction technique. COMPARISON:  MRI brain 04/05/2021. FINDINGS: Brain: Multiple hyperdense and cystic lesions with extensive surrounding vasogenic edema in both cerebral hemispheres in the left cerebellum. The dominant cystic lesion in the left parietal lobe measures up to 3.8 x 2.9 cm (image 18 series 2). A left subinsular lesion measures up to 2.6 x 2.4 cm (image 11 series 2). A lesion in the left caudate head measures up to 1.7 x 1.5 cm (image 13 series 2). There is 10 mm of associated rightward midline shift and early left uncal herniation. No hydrocephalus. No extra-axial collection. Vascular: No hyperdense vessel or unexpected calcification. Skull: Few lucent lesions in the left frontal and parietal bones are indeterminate. Sinuses/Orbits: Paranasal sinuses, mastoid air cells, and middle ear cavities are well aerated. Orbits are unremarkable. Other: None. IMPRESSION: 1. Multiple hyperdense and cystic lesions with extensive surrounding vasogenic edema in both cerebral hemispheres and left cerebellum, concerning for metastatic disease. There is 10 mm of rightward midline shift and early left uncal herniation. 2. Few lucent lesions in the left frontal and parietal bones are indeterminate. Critical Value/emergent  results were called by telephone at the time of interpretation on 03/06/2022 at 4:00 pm to provider [facility] , who verbally acknowledged these results. Electronically Signed   By: Emmit Alexanders M.D.   On: 03/06/2022 16:09    Scheduled Meds:  dexamethasone (DECADRON) injection  4 mg Intravenous Q6H   losartan  50 mg Oral Daily    Continuous Infusions:  sodium chloride 75 mL/hr (03/07/22 0814)   levETIRAcetam Stopped (03/07/22 0854)     LOS: 1 day   Time spent: 31 minutes  Kristan Votta Neva Seat, MD [facility] Pager [phone]  If 7PM-7AM, please contact night-coverage [email or web address] Password [facility] 03/07/2022, 9:51 AM

## 2022-03-07 NOTE — ED Notes (Signed)
Indwelling catheter placed.  Pt tolerated well.

## 2022-03-07 NOTE — Consult Note (Signed)
Consultation Note Date: 03/07/2022   Patient Name: Denise Moore  DOB: 27-Jan-1946  MRN: [id number]  Age / Sex: 77 y.o., female  PCP: Kirk Ruths, MD Referring Physician: Lucillie Garfinkel, MD  Reason for Consultation: Establishing goals of care  HPI/Patient Profile: 77 y.o. female  with past medical history of  Crohn's, HTN, carotid artery stenosis, and recent hospitalization from 1/7 to 02/11/22 with post COVID bacterial pneumonia  admitted on 03/06/2022 with AMS and progressive weakness.  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  Patient has been seen by neurosurgery, no intervention recommended.  Medical oncology consulted but has not seen yet.  Discussion evening of 2/1 with admitting physician and decision was made to change CODE STATUS to DNR/DNI but continue with other care.  PMT consulted to discuss goals of care.  Clinical Assessment and Goals of Care: I have reviewed medical records including EPIC notes, labs and imaging, assessed the patient and then met with patient's brother Denise Moore at bedside to discuss diagnosis prognosis, GOC, EOL wishes, disposition and options.  I introduced Palliative Medicine as specialized medical care for people living with serious illness. It focuses on providing relief from the symptoms and stress of a serious illness. The goal is to improve quality of life for both the patient and the family.  Denise Moore shares that patient's next of kin is her son Denise Moore who has just went home.  He tells me Denise Moore is shocked and overwhelmed with current situation.  He tells me he will be assisting Denise Moore with decision making.  During my discussion with Denise Clarks, [name] is confused.  She makes statements that do not align with conversation and makes a couple of attempts to get out of bed.  She does not have capacity to make medical decisions for herself at this point. She denies pain.  Her only complaint is  feeling hungry.  She remains n.p.o. at this point until she can be seen by pulmonology.  Denise Moore shares that patient lives with her son Denise Moore.  He tells me several weeks ago patient was incredibly active.  Her mind was sharp.  She had a great appetite.  She seemed to be in great health.  Her current status is incredibly shocking to family.  Her decline has been drastic.   We discussed patient's current illness and what it means in the larger context of patient's on-going co-morbidities.  Natural disease trajectory and expectations at EOL were discussed.  [name] understands the seriousness of patient's disease.  He understands she has metastatic cancer and is likely nearing end-of-life.  He understands options moving forward are likely quite limited.  We briefly discussed concerns about quality of life.  I attempted to elicit values and goals of care important to the patient.    The difference between aggressive medical intervention and comfort care was considered in light of the patient's goals of care.   Discussed with Denise Moore the importance of continued conversation with family and the medical providers regarding overall plan of care and treatment options, ensuring decisions are within the context of the patients values and GOCs.    [name] shares at this point he wants to hear from medical oncology just to hear if there are any potential options for treatment moving forward.  We discussed a potential transition to focus on comfort only.  We also briefly discussed disposition options.  We discussed rehab facilities versus home with home health versus home hospice versus hospice facility.  Denise Moore seems pretty certain he  would not want the patient to go to rehab facility but seems open to all other options discussed.  Questions and concerns were addressed. The family was encouraged to call with questions or concerns.  Shared with Denise Moore that I will be around the rest of the day and I am happy to speak  with them further after they talk to oncology.  I provided him my number to call me back or to give to patient's son to speak with me if he desires.  [name] tells me he feels at this point they just need some time to gather more information and discuss options amongst themselves.  We discussed that if I do not hear from them today we will have the palliative team member follow-up with him Monday.  He is agreeable to this plan.  Primary Decision Maker NEXT OF KIN    SUMMARY OF RECOMMENDATIONS   Family awaiting conversations with medical oncology and pulmonology -family requesting time to consider their options We briefly discussed cancer treatment versus focus on comfort Hospice also discussed briefly Family has my contact information and will call me back later today if needs arise otherwise we will plan to follow-up with them Monday Patient denies pain at this point, only complaint is feeling hungry  Code Status/Advance Care Planning: DNR     Primary Diagnoses: Present on Admission:  Crohn's disease of both small and large intestine ([place])   I have reviewed the medical record, interviewed the patient and family, and examined the patient. The following aspects are pertinent.  Past Medical History:  Diagnosis Date   Chronic ulcerative colitis ([place]) 01/22/2015   Last Assessment & Plan:  Post decadron treatment last month. Off now and feeling great.     GERD (gastroesophageal reflux disease)    Pancreatic insufficiency 03/07/2015   Last Assessment & Plan:  On her pancrease supplement.     Pure hypercholesterolemia 10/23/2014   Last Assessment & Plan:  On diet for now    Wears upper dentures    Social History   Socioeconomic History   Marital status: Widowed    Spouse name: Not on file   Number of children: Not on file   Years of education: Not on file   Highest education level: Not on file  Occupational History   Not on file  Tobacco Use   Smoking status: Every Day     Packs/day: 0.50    Years: 50.00    Total pack years: 25.00    Types: Cigarettes   Smokeless tobacco: Never  Substance and Sexual Activity   Alcohol use: No   Drug use: No   Sexual activity: Not on file  Other Topics Concern   Not on file  Social History Narrative   Not on file   Social Determinants of Health   Financial Resource Strain: Not on file  Food Insecurity: No Food Insecurity (03/07/2022)   Hunger Vital Sign    Worried About Running Out of Food in the Last Year: Never true    Ran Out of Food in the Last Year: Never true  Transportation Needs: No Transportation Needs (03/07/2022)   PRAPARE - Hydrologist (Medical): No    Lack of Transportation (Non-Medical): No  Physical Activity: Not on file  Stress: Not on file  Social Connections: Not on file   Family History  Problem Relation Age of Onset   Cancer Mother    Diabetes Father    Heart disease Father  Scheduled Meds:  dexamethasone (DECADRON) injection  4 mg Intravenous Q6H   losartan  50 mg Oral Daily   Continuous Infusions:  sodium chloride 75 mL/hr (03/07/22 0814)   levETIRAcetam Stopped (03/07/22 0854)   PRN Meds:.acetaminophen **OR** acetaminophen, hydrALAZINE, morphine injection, ondansetron **OR** ondansetron (ZOFRAN) IV Allergies  Allergen Reactions   Other Itching    Bandaids  Bandaids    Penicillins Rash    swelling   Review of Systems  Unable to perform ROS: Mental status change    Physical Exam Constitutional:      General: She is not in acute distress.    Appearance: She is ill-appearing.     Comments: Confused, slightly restless  Pulmonary:     Effort: Pulmonary effort is normal.  Skin:    General: Skin is warm and dry.     Vital Signs: BP (!) 162/59   Pulse 62   Temp 98.1 F (36.7 C)   Resp 17   Ht 5\' 4"  (1.626 m)   Wt 63.5 kg   SpO2 98%   BMI 24.03 kg/m  Pain Scale: 0-10   Pain Score: 0-No pain   SpO2: SpO2: 98 % O2 Device:SpO2: 98  % O2 Flow Rate: .   IO: Intake/output summary:  Intake/Output Summary (Last 24 hours) at 03/07/2022 1107 Last data filed at 03/06/2022 2112 Gross per 24 hour  Intake 100 ml  Output --  Net 100 ml    LBM:   Baseline Weight: Weight: 63.5 kg Most recent weight: Weight: 63.5 kg     Palliative Assessment/Data: PPS 40%     *Please note that this is a verbal dictation therefore any spelling or grammatical errors are due to the "[employer]" system interpretation.   Juel Burrow, DNP, AGNP-C Palliative Medicine Team [phone] Pager: [phone]

## 2022-03-07 NOTE — ED Notes (Addendum)
Pt oxygen dropped down to 87% on RA when getting up to the commode, placed on 3L

## 2022-03-07 NOTE — Consult Note (Signed)
Hematology/Oncology Consult note [phone] [phone]      Patient Care Team: Kirk Ruths, MD as PCP - General (Internal Medicine)   Name of the patient: Denise Moore  [id number]  [birth year]   REASON FOR COSULTATION:   History of presenting illness-  77 y.o. female with PMH listed at below who presents to ER for evaluation of generalized weakness,  altered mental status.  03/06/2022 CT chest abdomen pelvis with contrast showed 1. Anterior perihilar mass of the left upper lobe measuring 2.6 x 2.2 cm, consistent with primary lung malignancy. 2. Multiple bulky, necrotic appearing left hilar, AP window, and prevascular lymph nodes, consistent with nodal metastatic disease. 3. New heterogeneously hypodense bilateral adrenal masses, consistent with adrenal metastatic disease. 4. No other evidence of metastatic disease in the chest, abdomen, or  pelvis. 5. Emphysema.6. Coronary artery disease.7. Cholelithiasis. 8. Bilateral nonobstructive nephrolithiasis. Aortic Atherosclerosis (ICD10-I70.0) and Emphysema  03/06/2022, MRI brain without contrast showed multiple intraparenchymal metastatic cyst with mixed cystic and solid components and internal petechial hemorrhage.  There is a large amount of vasogenic edema surrounding multiple lesions, worst in the left temporal and parietal lobes.  Causing rightward midline shift of 6 mm.    Patient was started on dexamethasone.  Oncology was consulted for evaluation.  Patient is not able to provide any history.  She is confused.  Patient's son Raymonde was at the bedside.  Patient has lost weight unintentionally.  Currently she denies any headache, nausea vomiting shortness of breath.    Allergies  Allergen Reactions   Other Itching    Bandaids  Bandaids    Penicillins Rash    swelling    Patient Active Problem List   Diagnosis Date Noted   Lung cancer metastatic to brain, new diagnosis by CT 03/06/2022 ([place])  94/85/4627   Acute metabolic encephalopathy 03/50/0938   Hypertensive urgency 03/06/2022   CAP (community acquired pneumonia) 02/09/2022   Generalized weakness 02/09/2022   Hypokalemia 02/09/2022   Hypotension 02/09/2022   Diabetes mellitus, type II ([place]) 09/18/2021   Carotid artery disease ([place]) 10/14/2020   Crohn's disease of both small and large intestine ([place]) 11/24/2016   Crohn's colitis, without complications ([place]) 18/29/9371   Elevated anti-tissue transglutaminase (tTG) IgA level    Ulcerative pancolitis without complication ([place])    Blood pressure elevated without history of HTN 08/13/2015   HTN (hypertension), benign 08/13/2015   Pancreatic insufficiency 03/07/2015   Pure hypercholesterolemia 10/23/2014     Past Medical History:  Diagnosis Date   Chronic ulcerative colitis ([place]) 01/22/2015   Last Assessment & Plan:  Post decadron treatment last month. Off now and feeling great.     GERD (gastroesophageal reflux disease)    Pancreatic insufficiency 03/07/2015   Last Assessment & Plan:  On her pancrease supplement.     Pure hypercholesterolemia 10/23/2014   Last Assessment & Plan:  On diet for now    Wears upper dentures      Past Surgical History:  Procedure Laterality Date   COLONOSCOPY  11/08/2014   COLONOSCOPY N/A 10/22/2016   Procedure: COLONOSCOPY;  Surgeon: Lin Landsman, MD;  Location: [facility];  Service: Gastroenterology;  Laterality: N/A;   ESOPHAGOGASTRODUODENOSCOPY N/A 10/22/2016   Procedure: ESOPHAGOGASTRODUODENOSCOPY (EGD);  Surgeon: Lin Landsman, MD;  Location: [facility];  Service: Gastroenterology;  Laterality: N/A;   fistua repair  04/07/1956   PILONIDAL CYST EXCISION  10/21/1965    Social History   Socioeconomic History   Marital status:  Widowed    Spouse name: Not on file   Number of children: Not on file   Years of education: Not on file   Highest education level: Not on file  Occupational History   Not on file   Tobacco Use   Smoking status: Every Day    Packs/day: 0.50    Years: 50.00    Total pack years: 25.00    Types: Cigarettes   Smokeless tobacco: Never  Substance and Sexual Activity   Alcohol use: No   Drug use: No   Sexual activity: Not on file  Other Topics Concern   Not on file  Social History Narrative   Not on file   Social Determinants of Health   Financial Resource Strain: Not on file  Food Insecurity: No Food Insecurity (03/07/2022)   Hunger Vital Sign    Worried About Running Out of Food in the Last Year: Never true    Ran Out of Food in the Last Year: Never true  Transportation Needs: No Transportation Needs (03/07/2022)   PRAPARE - Hydrologist (Medical): No    Lack of Transportation (Non-Medical): No  Physical Activity: Not on file  Stress: Not on file  Social Connections: Not on file  Intimate Partner Violence: Not At Risk (02/10/2022)   Humiliation, Afraid, Rape, and Kick questionnaire    Fear of Current or Ex-Partner: No    Emotionally Abused: No    Physically Abused: No    Sexually Abused: No     Family History  Problem Relation Age of Onset   Cancer Mother    Diabetes Father    Heart disease Father      Current Facility-Administered Medications:    0.9 %  sodium chloride infusion, 75 mL/hr, Intravenous, Continuous, Athena Masse, MD, Last Rate: 75 mL/hr at 03/07/22 0814, 75 mL/hr at 03/07/22 0814   acetaminophen (TYLENOL) tablet 650 mg, 650 mg, Oral, Q4H PRN **OR** acetaminophen (TYLENOL) suppository 650 mg, 650 mg, Rectal, Q4H PRN, Athena Masse, MD   dexamethasone (DECADRON) injection 4 mg, 4 mg, Intravenous, Q6H, Athena Masse, MD, 4 mg at 03/07/22 1206   hydrALAZINE (APRESOLINE) injection 5 mg, 5 mg, Intravenous, Q4H PRN, Athena Masse, MD, 5 mg at 03/06/22 2032   levETIRAcetam (KEPPRA) IVPB 500 mg/100 mL premix, 500 mg, Intravenous, Q12H, Athena Masse, MD, Stopped at 03/07/22 0854   losartan (COZAAR) tablet 50  mg, 50 mg, Oral, Daily, Judd Gaudier V, MD, 50 mg at 03/06/22 2032   morphine (PF) 2 MG/ML injection 2 mg, 2 mg, Intravenous, Q3H PRN, Merlyn Lot, MD, 2 mg at 03/06/22 1801   ondansetron (ZOFRAN) tablet 4 mg, 4 mg, Oral, Q6H PRN **OR** ondansetron (ZOFRAN) injection 4 mg, 4 mg, Intravenous, Q6H PRN, Athena Masse, MD  Current Outpatient Medications:    celecoxib (CELEBREX) 100 MG capsule, Take 100 mg by mouth 2 (two) times daily., Disp: , Rfl:    nitrofurantoin, macrocrystal-monohydrate, (MACROBID) 100 MG capsule, Take 100 mg by mouth 2 (two) times daily., Disp: , Rfl:    Cetirizine HCl 10 MG CAPS, Take by mouth. (Patient not taking: Reported on 02/09/2022), Disp: , Rfl:    Cholecalciferol 25 MCG (1000 UT) tablet, Take by mouth. (Patient not taking: Reported on 02/09/2022), Disp: , Rfl:    gabapentin (NEURONTIN) 100 MG capsule, Take 200 mg by mouth 2 (two) times daily. (Patient not taking: Reported on 03/06/2022), Disp: , Rfl:    losartan (COZAAR)  50 MG tablet, Take 50 mg by mouth daily. (Patient not taking: Reported on 03/06/2022), Disp: , Rfl:    potassium chloride (KLOR-CON) 20 MEQ packet, Take 40 mEq by mouth daily for 2 days., Disp: 4 packet, Rfl: 0   rosuvastatin (CRESTOR) 20 MG tablet, Take by mouth., Disp: , Rfl:    Turmeric 1053 MG TABS, Take by mouth. (Patient not taking: Reported on 04/30/2021), Disp: , Rfl:   Review of Systems  Unable to perform ROS: Mental status change  Gastrointestinal:  Negative for nausea.  Neurological:  Negative for headaches.    PHYSICAL EXAM Vitals:   03/07/22 1030 03/07/22 1100 03/07/22 1200 03/07/22 1235  BP: (!) 133/54 (!) 140/50  (!) 149/93  Pulse: 75 83  73  Resp: 16 20  17   Temp:   97.9 F (36.6 C)   TempSrc:      SpO2: 90% 91%  92%  Weight:      Height:       Physical Exam HENT:     Head: Normocephalic and atraumatic.     Mouth/Throat:     Pharynx: No oropharyngeal exudate.  Eyes:     General: No scleral icterus. Cardiovascular:      Rate and Rhythm: Normal rate.     Heart sounds: No murmur heard. Pulmonary:     Effort: Pulmonary effort is normal. No respiratory distress.  Abdominal:     General: There is no distension.     Palpations: Abdomen is soft.  Musculoskeletal:        General: Normal range of motion.     Cervical back: Normal range of motion and neck supple.  Skin:    General: Skin is warm and dry.     Findings: No erythema.  Neurological:     Mental Status: She is alert.     Motor: No abnormal muscle tone.     Comments: Confused  Psychiatric:        Mood and Affect: Affect normal.       LABORATORY STUDIES    Latest Ref Rng & Units 03/06/2022    3:00 PM 02/11/2022    5:16 AM 02/10/2022    2:14 AM  CBC  WBC 4.0 - 10.5 K/uL 9.3  11.9  16.0   Hemoglobin 12.0 - 15.0 g/dL 14.5  9.3  9.7   Hematocrit 36.0 - 46.0 % 43.2  27.9  29.9   Platelets 150 - 400 K/uL 215  246  220       Latest Ref Rng & Units 03/06/2022    3:00 PM 02/11/2022    5:16 AM 02/10/2022    2:14 AM  CMP  Glucose 70 - 99 mg/dL 108  101  108   BUN 8 - 23 mg/dL 27  7  8    Creatinine 0.44 - 1.00 mg/dL 0.75  0.61  0.47   Sodium 135 - 145 mmol/L 134  135  136   Potassium 3.5 - 5.1 mmol/L 3.6  3.0  2.9   Chloride 98 - 111 mmol/L 98  103  105   CO2 22 - 32 mmol/L 22  24  22    Calcium 8.9 - 10.3 mg/dL 9.4  7.7  7.5   Total Protein 6.5 - 8.1 g/dL 8.4     Total Bilirubin 0.3 - 1.2 mg/dL 0.9     Alkaline Phos 38 - 126 U/L 68     AST 15 - 41 U/L 20     ALT 0 - 44 U/L  14        RADIOGRAPHIC STUDIES: I have personally reviewed the radiological images as listed and agreed with the findings in the report. MR BRAIN WO CONTRAST  Result Date: 03/06/2022 CLINICAL DATA:  Intracranial metastatic disease EXAM: MRI HEAD WITHOUT CONTRAST TECHNIQUE: Multiplanar, multiecho pulse sequences of the brain and surrounding structures were obtained without intravenous contrast. COMPARISON:  CT 03/06/2022 Brain MRI 04/05/2021 FINDINGS: Brain: No acute infarct.  There are multiple intraparenchymal metastases with mixed cystic and solid components and areas of internal petechial hemorrhage. There is a large amount of vasogenic edema surrounding multiple lesions, worst in the left temporal and parietal lobes, causing rightward midline shift 6 mm. The largest lesion is located in the left occipital lobe and measures 3.6 cm. There are at least 9 lesions. More accurate measurement could be performed with postcontrast imaging. Vascular: Normal flow voids. Skull and upper cervical spine: Normal marrow signal. Sinuses/Orbits: Negative. Other: None. IMPRESSION: Multiple intraparenchymal metastases with mixed cystic and solid components and areas of internal petechial hemorrhage. There is a large amount of vasogenic edema surrounding multiple lesions, worst in the left temporal and parietal lobes, causing rightward midline shift of 6 mm. More accurate measurement could be performed with postcontrast imaging. Electronically Signed   By: Ulyses Jarred M.D.   On: 03/06/2022 23:03   CT CHEST ABDOMEN PELVIS W CONTRAST  Result Date: 03/06/2022 CLINICAL DATA:  Brain metastases, unknown primary * Tracking Code: BO * EXAM: CT CHEST, ABDOMEN, AND PELVIS WITH CONTRAST TECHNIQUE: Multidetector CT imaging of the chest, abdomen and pelvis was performed following the standard protocol during bolus administration of intravenous contrast. RADIATION DOSE REDUCTION: This exam was performed according to the departmental dose-optimization program which includes automated exposure control, adjustment of the mA and/or kV according to patient size and/or use of iterative reconstruction technique. CONTRAST:  48mL OMNIPAQUE IOHEXOL 350 MG/ML SOLN COMPARISON:  CT abdomen, 10/14/2016 FINDINGS: CT CHEST FINDINGS Cardiovascular: Aortic atherosclerosis. Normal heart size. Three-vessel coronary artery calcifications. No pericardial effusion. Mediastinum/Nodes: Multiple bulky, necrotic appearing left hilar, AP  window, and prevascular lymph nodes measuring up to 3.3 x 3.3 cm (series 2, image 20). Thyroid gland, trachea, and esophagus demonstrate no significant findings. Lungs/Pleura: Moderate centrilobular emphysema. Anterior perihilar mass of the left upper lobe measuring 2.6 x 2.2 cm (series 2, image 24). No pleural effusion or pneumothorax. Musculoskeletal: No chest wall abnormality. No acute osseous findings. CT ABDOMEN PELVIS FINDINGS Hepatobiliary: No solid liver abnormality is seen. Tiny gallstones. No gallbladder wall thickening, or biliary dilatation. Pancreas: Unremarkable. No pancreatic ductal dilatation or surrounding inflammatory changes. Spleen: Normal in size without significant abnormality. Adrenals/Urinary Tract: New heterogeneously hypodense bilateral adrenal masses, larger on the right measuring 3.7 x 2.6 cm (series 2, image 55). Multiple simple, benign bilateral renal cortical cysts, for which no further follow-up or characterization is required. Multiple small bilateral nonobstructive renal calculi. Multifocal bilateral renal cortical scarring in keeping with prior infectious or obstructive insult. No ureteral calculi or hydronephrosis. Bladder is normal. Stomach/Bowel: Stomach is within normal limits. Appendix appears normal. No evidence of bowel wall thickening, distention, or inflammatory changes. Sigmoid diverticula. Vascular/Lymphatic: No significant vascular findings are present. No enlarged abdominal or pelvic lymph nodes. Reproductive: No mass or other abnormality. Other: No abdominal wall hernia or abnormality. No ascites. Musculoskeletal: No acute osseous findings. Unchanged benign fat containing vertebral body hemangioma of L5 (series 6, image 62). IMPRESSION: 1. Anterior perihilar mass of the left upper lobe measuring 2.6 x 2.2 cm, consistent with  primary lung malignancy. 2. Multiple bulky, necrotic appearing left hilar, AP window, and prevascular lymph nodes, consistent with nodal  metastatic disease. 3. New heterogeneously hypodense bilateral adrenal masses, consistent with adrenal metastatic disease. 4. No other evidence of metastatic disease in the chest, abdomen, or pelvis. 5. Emphysema. 6. Coronary artery disease. 7. Cholelithiasis. 8. Bilateral nonobstructive nephrolithiasis. Aortic Atherosclerosis (ICD10-I70.0) and Emphysema (ICD10-J43.9). Electronically Signed   By: Delanna Ahmadi M.D.   On: 03/06/2022 18:50   CT HEAD WO CONTRAST (5MM)  Result Date: 03/06/2022 CLINICAL DATA:  Mental status change, unknown cause. Altered mental status and right arm pain. EXAM: CT HEAD WITHOUT CONTRAST TECHNIQUE: Contiguous axial images were obtained from the base of the skull through the vertex without intravenous contrast. RADIATION DOSE REDUCTION: This exam was performed according to the departmental dose-optimization program which includes automated exposure control, adjustment of the mA and/or kV according to patient size and/or use of iterative reconstruction technique. COMPARISON:  MRI brain 04/05/2021. FINDINGS: Brain: Multiple hyperdense and cystic lesions with extensive surrounding vasogenic edema in both cerebral hemispheres in the left cerebellum. The dominant cystic lesion in the left parietal lobe measures up to 3.8 x 2.9 cm (image 18 series 2). A left subinsular lesion measures up to 2.6 x 2.4 cm (image 11 series 2). A lesion in the left caudate head measures up to 1.7 x 1.5 cm (image 13 series 2). There is 10 mm of associated rightward midline shift and early left uncal herniation. No hydrocephalus. No extra-axial collection. Vascular: No hyperdense vessel or unexpected calcification. Skull: Few lucent lesions in the left frontal and parietal bones are indeterminate. Sinuses/Orbits: Paranasal sinuses, mastoid air cells, and middle ear cavities are well aerated. Orbits are unremarkable. Other: None. IMPRESSION: 1. Multiple hyperdense and cystic lesions with extensive surrounding  vasogenic edema in both cerebral hemispheres and left cerebellum, concerning for metastatic disease. There is 10 mm of rightward midline shift and early left uncal herniation. 2. Few lucent lesions in the left frontal and parietal bones are indeterminate. Critical Value/emergent results were called by telephone at the time of interpretation on 03/06/2022 at 4:00 pm to provider [facility] , who verbally acknowledged these results. Electronically Signed   By: Emmit Alexanders M.D.   On: 03/06/2022 16:09   DG Chest 2 View  Result Date: 02/09/2022 CLINICAL DATA:  Cough, shortness of breath. EXAM: CHEST - 2 VIEW COMPARISON:  Report from chest radiograph dated 12/25/2014. FINDINGS: The heart size and mediastinal contours are within normal limits. Moderate airspace opacities are seen in the right middle lobe. The left lung is clear. There is no pleural effusion or pneumothorax. The visualized skeletal structures are unremarkable. IMPRESSION: Moderate airspace opacities in the right middle lobe consistent with pneumonia. Electronically Signed   By: Zerita Boers M.D.   On: 02/09/2022 15:42     Assessment and plan-   # Altered mental status secondary to multiple brain lesions with vasogenic edema, concerning for brain metastasis. Status post dexamethasone loading dose 10 mg, continue dexamethasone 4 mg every 6 hours.  # Perihilar mass with bulky hilar and mediastinal lymphadenopathy, bilateral adrenal masses suspecting metastatic lung cancer, possible small cell lung cancer Patient is not competent due to altered mental status. I had a lengthy discussion with patient's son [name] at the bedside.  Son is not decided whether to proceed with further workup.  If he and the family members interested, I recommend bronchoscopy biopsy by pulmonology.  Patient will also benefit from palliative radiation of the  brain. Alternative option of comfort care/hospice was discussed with patient's son.  Palliative care service has  been consulted.  Thank you for allowing me to participate in the care of this patient.   Earlie Server, MD, PhD Hematology Oncology 03/07/2022

## 2022-03-08 DIAGNOSIS — G9341 Metabolic encephalopathy: Secondary | ICD-10-CM | POA: Diagnosis not present

## 2022-03-08 LAB — CBC
HCT: 38.2 % (ref 36.0–46.0)
Hemoglobin: 12.8 g/dL (ref 12.0–15.0)
MCH: 30.3 pg (ref 26.0–34.0)
MCHC: 33.5 g/dL (ref 30.0–36.0)
MCV: 90.3 fL (ref 80.0–100.0)
Platelets: 203 K/uL (ref 150–400)
RBC: 4.23 MIL/uL (ref 3.87–5.11)
RDW: 14 % (ref 11.5–15.5)
WBC: 10.4 K/uL (ref 4.0–10.5)
nRBC: 0 % (ref 0.0–0.2)

## 2022-03-08 LAB — BASIC METABOLIC PANEL WITH GFR
Anion gap: 7 (ref 5–15)
BUN: 31 mg/dL — ABNORMAL HIGH (ref 8–23)
CO2: 23 mmol/L (ref 22–32)
Calcium: 8.6 mg/dL — ABNORMAL LOW (ref 8.9–10.3)
Chloride: 106 mmol/L (ref 98–111)
Creatinine, Ser: 0.48 mg/dL (ref 0.44–1.00)
GFR, Estimated: >60 mL/min (ref >60)
Glucose, Bld: 127 mg/dL — ABNORMAL HIGH (ref 70–99)
Potassium: 3.6 mmol/L (ref 3.5–5.1)
Sodium: 136 mmol/L (ref 135–145)

## 2022-03-08 MED ORDER — SCOPOLAMINE 1 MG/3DAYS TD PT72
1.0000 | MEDICATED_PATCH | TRANSDERMAL | Status: DC
  Administered 2022-03-08: 1.5 mg via TRANSDERMAL
  Filled 2022-03-08: qty 1

## 2022-03-08 MED ORDER — LORAZEPAM 2 MG/ML PO CONC: 1.0000 mg | ORAL | Status: DC | PRN

## 2022-03-08 NOTE — Progress Notes (Signed)
[facility] Nurse Liaison Note  Received IPU (Inpatient Unit) referral from [place], LCSW.  I met with Denise Moore, her son, Denise Moore and her brother Denise Moore at the bedside.  Denise Moore is awake, alert on my visit.  She is not able to express her words correctly and her speech does not make sense at times.  She tells me clearly who her son is, his name and also tells me who her brother is and his name.  She heard the word hospice and says no- absolutely not, I'm not ready for that.  She seems to have capacity to understand but she can't form the correct words at all times to express her thoughts.  She confirms this when I asked her.    I took [place] to a conference room, at their request to discuss hospice philosophy, hospice services and disposition.    We had a good talk with much education provided regarding inpatient hospice requirements vs. home hospice services.  They would like to proceed with hospice home if she meets criteria.  If she does not, they will look at taking her home with hospice.    Spoke with Dr. Gildardo Cranker, hospice physician.  Denise Moore has been approved for inpatient hospice.  Bed offer made and and accepted by Denise Moore, patient's son.  Plan for transport to the [facility] today.  RN to call report to [phone]. This RN will set up transportation via EMS and send discharge summary to the [facility].  Will continue to follow through final disposition.  Thank you for allowing participation in this patient's care.  Dimas Aguas, RN Nurse Liaison [phone]

## 2022-03-08 NOTE — Progress Notes (Signed)
PIV left in place for hospice home. Foley left in place. Report given to Otila Kluver at hospice home.

## 2022-03-08 NOTE — TOC Transition Note (Addendum)
Transition of Care [facility]) - CM/SW Discharge Note   Patient Details  Name: Denise Moore MRN: [id number] Date of Birth: [birth year]  [facility] CM/SW Contact:  Magnus Ivan, LCSW Phone Number: 03/08/2022, 12:57 PM   Clinical Narrative:    Patient is discharging to [facility] today. EMS paperwork completed. [facility] Representative Marcene Brawn has updated family and per [place], she will call EMS when hospice home is ready to receive the patient.  Care Team aware.    Final next level of care: [facility] Barriers to Discharge: Barriers Resolved   Patient Goals and CMS Choice      Discharge Placement                Patient chooses bed at: Other - please specify in the comment section below: ([place], [place]) Patient to be transferred to facility by: [facility] Name of family member notified: Family notified by [place] with [facility] Patient and family notified of of transfer: 03/08/22  Discharge Plan and Services Additional resources added to the After Visit Summary for                                       Social Determinants of Health (SDOH) Interventions SDOH Screenings   Food Insecurity: No Food Insecurity (03/07/2022)  Housing: Low Risk  (03/07/2022)  Transportation Needs: No Transportation Needs (03/07/2022)  Utilities: Not At Risk (03/07/2022)  Tobacco Use: High Risk (03/07/2022)     Readmission Risk Interventions     No data to display

## 2022-03-08 NOTE — Discharge Summary (Signed)
Discharge Summary  Denise Moore OVF:[id number] DOB: [birth year]  PCP: Kirk Ruths, MD  Admit date: 03/06/2022 Discharge date: 03/08/2022   Recommendations for Outpatient Follow-up:  Please follow up with your PCP with CBC and BMP in 1-2 weeks  Discharge Diagnoses:  Active Hospital Problems   Diagnosis Date Noted   Acute metabolic encephalopathy 84/16/6063    Priority: 2.   Lung cancer metastatic to brain, new diagnosis by CT 03/06/2022 ([place]) 03/06/2022    Priority: 1.   Hypertensive urgency 03/06/2022    Priority: 3.   Diabetes mellitus, type II ([place]) 09/18/2021    Priority: 4.   Lung mass 03/07/2022   Thoracic lymphadenopathy 03/07/2022   Metastasis to brain [facility]) 03/07/2022   Crohn's disease of both small and large intestine ([place]) 11/24/2016    Resolved Hospital Problems  No resolved problems to display.   Discharge Condition: Stable   Diet recommendation: Diet Orders (From admission, onward)     Start     Ordered   03/08/22 1251  Diet regular Room service appropriate? Yes; Fluid consistency: Thin  Diet effective now       Question Answer Comment  Room service appropriate? Yes   Fluid consistency: Thin      03/08/22 1250           HPI and Brief Hospital Course:  Denise Moore is a 77 y.o. female with medical history significant for Crohn's, HTN, carotid artery stenosis, recently hospitalized from 1/7 to 02/11/22 with post COVID bacterial pneumonia presenting with generalized weakness, nausea and vomiting with CXR showing right middle lobe opacity, who returns to the ED for evaluation of altered mental status and progressive weakness. Unfortunately, workup in the ER shows suspected lung malignancy with brain metastases. She was seen by neurosurgery, pulmonary and oncology. Also met with palliative care. Decision was made by her son and brother to make her comfort care and she is going to hospice house later  today.  Consultations: Neurosurgery Pulmonary Oncology Palliative Care  Discharge details, plan of care and follow up instructions were discussed with patient and any available family or care providers. Patient and family are in agreement with discharge from the hospital today and all questions were answered to their satisfaction.  Discharge Exam: BP (!) 159/77 (BP Location: Right Arm)   Pulse 70   Temp 98.6 F (37 C) (Oral)   Resp 16   Ht 5\' 4"  (1.626 m)   Wt 52.4 kg   SpO2 100%   BMI 19.83 kg/m  General:  Alert, oriented, calm, in no acute distress, seen this AM lying in bed with brother and son at the bedside  Eyes: EOMI, clear sclerea Neck: supple, no masses, trachea mildline  Cardiovascular: RRR, no murmurs or rubs, no peripheral edema  Respiratory: clear to auscultation bilaterally, no wheezes, no crackles  Abdomen: soft, nontender, nondistended, normal bowel tones heard  Skin: dry, no rashes  Musculoskeletal: no joint effusions, normal range of motion  Psychiatric: appropriate affect, normal speech  Neurologic: extraocular muscles intact, clear speech, moving all extremities with intact sensorium   Discharge Instructions You were cared for by a hospitalist during your hospital stay. If you have any questions about your discharge medications or the care you received while you were in the hospital after you are discharged, you can call the unit and asked to speak with the hospitalist on call if the hospitalist that took care of you is not available. Once you are discharged, your primary care physician will handle  any further medical issues. Please note that NO REFILLS for any discharge medications will be authorized once you are discharged, as it is imperative that you return to your primary care physician (or establish a relationship with a primary care physician if you do not have one) for your aftercare needs so that they can reassess your need for medications and monitor  your lab values.   Allergies as of 03/08/2022       Reactions   Other Itching   Bandaids  Bandaids    Penicillins Rash   swelling        Medication List     STOP taking these medications    celecoxib 100 MG capsule Commonly known as: CELEBREX   Cetirizine HCl 10 MG Caps   Cholecalciferol 25 MCG (1000 UT) tablet   gabapentin 100 MG capsule Commonly known as: NEURONTIN   losartan 50 MG tablet Commonly known as: COZAAR   nitrofurantoin (macrocrystal-monohydrate) 100 MG capsule Commonly known as: MACROBID   potassium chloride 20 MEQ packet Commonly known as: KLOR-CON   rosuvastatin 20 MG tablet Commonly known as: CRESTOR   Turmeric 1053 MG Tabs       Allergies  Allergen Reactions   Other Itching    Bandaids  Bandaids    Penicillins Rash    swelling     The results of significant diagnostics from this hospitalization (including imaging, microbiology, ancillary and laboratory) are listed below for reference.    Significant Diagnostic Studies: MR BRAIN WO CONTRAST  Result Date: 03/06/2022 CLINICAL DATA:  Intracranial metastatic disease EXAM: MRI HEAD WITHOUT CONTRAST TECHNIQUE: Multiplanar, multiecho pulse sequences of the brain and surrounding structures were obtained without intravenous contrast. COMPARISON:  CT 03/06/2022 Brain MRI 04/05/2021 FINDINGS: Brain: No acute infarct. There are multiple intraparenchymal metastases with mixed cystic and solid components and areas of internal petechial hemorrhage. There is a large amount of vasogenic edema surrounding multiple lesions, worst in the left temporal and parietal lobes, causing rightward midline shift 6 mm. The largest lesion is located in the left occipital lobe and measures 3.6 cm. There are at least 9 lesions. More accurate measurement could be performed with postcontrast imaging. Vascular: Normal flow voids. Skull and upper cervical spine: Normal marrow signal. Sinuses/Orbits: Negative. Other: None.  IMPRESSION: Multiple intraparenchymal metastases with mixed cystic and solid components and areas of internal petechial hemorrhage. There is a large amount of vasogenic edema surrounding multiple lesions, worst in the left temporal and parietal lobes, causing rightward midline shift of 6 mm. More accurate measurement could be performed with postcontrast imaging. Electronically Signed   By: Ulyses Jarred M.D.   On: 03/06/2022 23:03   CT CHEST ABDOMEN PELVIS W CONTRAST  Result Date: 03/06/2022 CLINICAL DATA:  Brain metastases, unknown primary * Tracking Code: BO * EXAM: CT CHEST, ABDOMEN, AND PELVIS WITH CONTRAST TECHNIQUE: Multidetector CT imaging of the chest, abdomen and pelvis was performed following the standard protocol during bolus administration of intravenous contrast. RADIATION DOSE REDUCTION: This exam was performed according to the departmental dose-optimization program which includes automated exposure control, adjustment of the mA and/or kV according to patient size and/or use of iterative reconstruction technique. CONTRAST:  27mL OMNIPAQUE IOHEXOL 350 MG/ML SOLN COMPARISON:  CT abdomen, 10/14/2016 FINDINGS: CT CHEST FINDINGS Cardiovascular: Aortic atherosclerosis. Normal heart size. Three-vessel coronary artery calcifications. No pericardial effusion. Mediastinum/Nodes: Multiple bulky, necrotic appearing left hilar, AP window, and prevascular lymph nodes measuring up to 3.3 x 3.3 cm (series 2, image 20). Thyroid gland, trachea,  and esophagus demonstrate no significant findings. Lungs/Pleura: Moderate centrilobular emphysema. Anterior perihilar mass of the left upper lobe measuring 2.6 x 2.2 cm (series 2, image 24). No pleural effusion or pneumothorax. Musculoskeletal: No chest wall abnormality. No acute osseous findings. CT ABDOMEN PELVIS FINDINGS Hepatobiliary: No solid liver abnormality is seen. Tiny gallstones. No gallbladder wall thickening, or biliary dilatation. Pancreas: Unremarkable. No  pancreatic ductal dilatation or surrounding inflammatory changes. Spleen: Normal in size without significant abnormality. Adrenals/Urinary Tract: New heterogeneously hypodense bilateral adrenal masses, larger on the right measuring 3.7 x 2.6 cm (series 2, image 55). Multiple simple, benign bilateral renal cortical cysts, for which no further follow-up or characterization is required. Multiple small bilateral nonobstructive renal calculi. Multifocal bilateral renal cortical scarring in keeping with prior infectious or obstructive insult. No ureteral calculi or hydronephrosis. Bladder is normal. Stomach/Bowel: Stomach is within normal limits. Appendix appears normal. No evidence of bowel wall thickening, distention, or inflammatory changes. Sigmoid diverticula. Vascular/Lymphatic: No significant vascular findings are present. No enlarged abdominal or pelvic lymph nodes. Reproductive: No mass or other abnormality. Other: No abdominal wall hernia or abnormality. No ascites. Musculoskeletal: No acute osseous findings. Unchanged benign fat containing vertebral body hemangioma of L5 (series 6, image 62). IMPRESSION: 1. Anterior perihilar mass of the left upper lobe measuring 2.6 x 2.2 cm, consistent with primary lung malignancy. 2. Multiple bulky, necrotic appearing left hilar, AP window, and prevascular lymph nodes, consistent with nodal metastatic disease. 3. New heterogeneously hypodense bilateral adrenal masses, consistent with adrenal metastatic disease. 4. No other evidence of metastatic disease in the chest, abdomen, or pelvis. 5. Emphysema. 6. Coronary artery disease. 7. Cholelithiasis. 8. Bilateral nonobstructive nephrolithiasis. Aortic Atherosclerosis (ICD10-I70.0) and Emphysema (ICD10-J43.9). Electronically Signed   By: Delanna Ahmadi M.D.   On: 03/06/2022 18:50   CT HEAD WO CONTRAST (5MM)  Result Date: 03/06/2022 CLINICAL DATA:  Mental status change, unknown cause. Altered mental status and right arm pain.  EXAM: CT HEAD WITHOUT CONTRAST TECHNIQUE: Contiguous axial images were obtained from the base of the skull through the vertex without intravenous contrast. RADIATION DOSE REDUCTION: This exam was performed according to the departmental dose-optimization program which includes automated exposure control, adjustment of the mA and/or kV according to patient size and/or use of iterative reconstruction technique. COMPARISON:  MRI brain 04/05/2021. FINDINGS: Brain: Multiple hyperdense and cystic lesions with extensive surrounding vasogenic edema in both cerebral hemispheres in the left cerebellum. The dominant cystic lesion in the left parietal lobe measures up to 3.8 x 2.9 cm (image 18 series 2). A left subinsular lesion measures up to 2.6 x 2.4 cm (image 11 series 2). A lesion in the left caudate head measures up to 1.7 x 1.5 cm (image 13 series 2). There is 10 mm of associated rightward midline shift and early left uncal herniation. No hydrocephalus. No extra-axial collection. Vascular: No hyperdense vessel or unexpected calcification. Skull: Few lucent lesions in the left frontal and parietal bones are indeterminate. Sinuses/Orbits: Paranasal sinuses, mastoid air cells, and middle ear cavities are well aerated. Orbits are unremarkable. Other: None. IMPRESSION: 1. Multiple hyperdense and cystic lesions with extensive surrounding vasogenic edema in both cerebral hemispheres and left cerebellum, concerning for metastatic disease. There is 10 mm of rightward midline shift and early left uncal herniation. 2. Few lucent lesions in the left frontal and parietal bones are indeterminate. Critical Value/emergent results were called by telephone at the time of interpretation on 03/06/2022 at 4:00 pm to provider [facility] , who verbally acknowledged these  results. Electronically Signed   By: Emmit Alexanders M.D.   On: 03/06/2022 16:09   DG Chest 2 View  Result Date: 02/09/2022 CLINICAL DATA:  Cough, shortness of breath. EXAM:  CHEST - 2 VIEW COMPARISON:  Report from chest radiograph dated 12/25/2014. FINDINGS: The heart size and mediastinal contours are within normal limits. Moderate airspace opacities are seen in the right middle lobe. The left lung is clear. There is no pleural effusion or pneumothorax. The visualized skeletal structures are unremarkable. IMPRESSION: Moderate airspace opacities in the right middle lobe consistent with pneumonia. Electronically Signed   By: Zerita Boers M.D.   On: 02/09/2022 15:42    Microbiology: No results found for this or any previous visit (from the past 240 hour(s)).   Labs: Basic Metabolic Panel: Recent Labs  Lab 03/06/22 1500 03/08/22 0521  NA 134* 136  K 3.6 3.6  CL 98 106  CO2 22 23  GLUCOSE 108* 127*  BUN 27* 31*  CREATININE 0.75 0.48  CALCIUM 9.4 8.6*   Liver Function Tests: Recent Labs  Lab 03/06/22 1500  AST 20  ALT 14  ALKPHOS 68  BILITOT 0.9  PROT 8.4*  ALBUMIN 4.3   No results for input(s): "LIPASE", "AMYLASE" in the last 168 hours. Recent Labs  Lab 03/06/22 1500  AMMONIA 17   CBC: Recent Labs  Lab 03/06/22 1500 03/08/22 0521  WBC 9.3 10.4  HGB 14.5 12.8  HCT 43.2 38.2  MCV 89.1 90.3  PLT 215 203   Cardiac Enzymes: No results for input(s): "CKTOTAL", "CKMB", "CKMBINDEX", "TROPONINI" in the last 168 hours. BNP: BNP (last 3 results) No results for input(s): "BNP" in the last 8760 hours.  ProBNP (last 3 results) No results for input(s): "PROBNP" in the last 8760 hours.  CBG: No results for input(s): "GLUCAP" in the last 168 hours.  Time spent: > 30 minutes were spent in preparing this discharge including medication reconciliation, counseling, and coordination of care.  Signed:  Jamielyn Petrucci Neva Seat, MD  [facility] 03/08/2022, 1:32 PM

## 2022-03-08 NOTE — Progress Notes (Signed)
Pt removed PIV, attepmts to replace unsuccessful, pt uncooperative. Discharge doc given. Report already given by am RN, Pt left unit for Hospice care with transport x2,

## 2022-06-04 DEATH — deceased

## 2022-10-31 ENCOUNTER — Encounter (INDEPENDENT_AMBULATORY_CARE_PROVIDER_SITE_OTHER): Payer: Medicare Other

## 2022-10-31 ENCOUNTER — Ambulatory Visit (INDEPENDENT_AMBULATORY_CARE_PROVIDER_SITE_OTHER): Payer: Medicare Other | Admitting: Vascular Surgery
